# Patient Record
Sex: Female | Born: 1990 | Race: Black or African American | Hispanic: No | Marital: Married | Smoking: Never smoker
Health system: Southern US, Community
[De-identification: ages and names within clinical notes are randomized; demographics above are authoritative.]

## PROBLEM LIST (undated history)

## (undated) ENCOUNTER — Inpatient Hospital Stay (HOSPITAL_COMMUNITY): Payer: Self-pay

## (undated) DIAGNOSIS — E282 Polycystic ovarian syndrome: Secondary | ICD-10-CM

## (undated) DIAGNOSIS — Z789 Other specified health status: Secondary | ICD-10-CM

## (undated) DIAGNOSIS — O039 Complete or unspecified spontaneous abortion without complication: Secondary | ICD-10-CM

---

## 2009-06-16 ENCOUNTER — Emergency Department (HOSPITAL_COMMUNITY): Admission: EM | Admit: 2009-06-16 | Discharge: 2009-06-16 | Payer: Self-pay | Admitting: Emergency Medicine

## 2010-11-27 LAB — POCT I-STAT, CHEM 8
Hemoglobin: 15.3 g/dL — ABNORMAL HIGH (ref 12.0–15.0)
Sodium: 139 mEq/L (ref 135–145)
TCO2: 21 mmol/L (ref 0–100)

## 2010-11-27 LAB — SALICYLATE LEVEL
Salicylate Lvl: 10.2 mg/dL (ref 2.8–20.0)
Salicylate Lvl: 9.2 mg/dL (ref 2.8–20.0)

## 2010-11-27 LAB — RAPID URINE DRUG SCREEN, HOSP PERFORMED: Tetrahydrocannabinol: NOT DETECTED

## 2010-11-27 LAB — ACETAMINOPHEN LEVEL: Acetaminophen (Tylenol), Serum: 12.4 ug/mL (ref 10–30)

## 2011-02-15 ENCOUNTER — Emergency Department (HOSPITAL_COMMUNITY): Payer: No Typology Code available for payment source

## 2011-02-15 ENCOUNTER — Emergency Department (HOSPITAL_COMMUNITY)
Admission: EM | Admit: 2011-02-15 | Discharge: 2011-02-15 | Disposition: A | Discharge status: Home / Self Care | Payer: No Typology Code available for payment source | Attending: Emergency Medicine | Admitting: Emergency Medicine

## 2011-02-15 DIAGNOSIS — N946 Dysmenorrhea, unspecified: Secondary | ICD-10-CM | POA: Insufficient documentation

## 2011-02-15 DIAGNOSIS — R109 Unspecified abdominal pain: Secondary | ICD-10-CM | POA: Insufficient documentation

## 2011-02-15 LAB — CBC
MCHC: 34.4 g/dL (ref 30.0–36.0)
MCV: 89.4 fL (ref 78.0–100.0)
Platelets: 227 10*3/uL (ref 150–400)
RDW: 13.4 % (ref 11.5–15.5)
WBC: 4.5 10*3/uL (ref 4.0–10.5)

## 2011-02-15 LAB — URINALYSIS, ROUTINE W REFLEX MICROSCOPIC
Bilirubin Urine: NEGATIVE
Ketones, ur: NEGATIVE mg/dL
Leukocytes, UA: NEGATIVE
Nitrite: NEGATIVE
Protein, ur: NEGATIVE mg/dL
Urobilinogen, UA: 1 mg/dL (ref 0.0–1.0)
pH: 7 (ref 5.0–8.0)

## 2011-02-15 LAB — BASIC METABOLIC PANEL
Calcium: 9.9 mg/dL (ref 8.4–10.5)
Chloride: 103 mEq/L (ref 96–112)
Creatinine, Ser: 0.82 mg/dL (ref 0.50–1.10)
GFR calc Af Amer: >60 mL/min (ref >60)
GFR calc non Af Amer: >60 mL/min (ref >60)

## 2011-02-15 LAB — POCT PREGNANCY, URINE: Preg Test, Ur: NEGATIVE

## 2011-02-15 LAB — DIFFERENTIAL
Basophils Absolute: 0 10*3/uL (ref 0.0–0.1)
Eosinophils Absolute: 0.2 10*3/uL (ref 0.0–0.7)
Eosinophils Relative: 3 % (ref 0–5)
Lymphs Abs: 1.7 10*3/uL (ref 0.7–4.0)
Monocytes Absolute: 0.5 10*3/uL (ref 0.1–1.0)

## 2011-02-15 LAB — WET PREP, GENITAL

## 2011-02-17 LAB — GC/CHLAMYDIA PROBE AMP, GENITAL
Chlamydia, DNA Probe: NEGATIVE
GC Probe Amp, Genital: NEGATIVE

## 2011-05-14 ENCOUNTER — Emergency Department (HOSPITAL_COMMUNITY)
Admission: EM | Admit: 2011-05-14 | Discharge: 2011-05-15 | Disposition: A | Discharge status: Home / Self Care | Payer: PRIVATE HEALTH INSURANCE | Attending: Emergency Medicine | Admitting: Emergency Medicine

## 2011-05-14 DIAGNOSIS — N949 Unspecified condition associated with female genital organs and menstrual cycle: Secondary | ICD-10-CM | POA: Insufficient documentation

## 2011-05-14 DIAGNOSIS — R109 Unspecified abdominal pain: Secondary | ICD-10-CM | POA: Insufficient documentation

## 2011-05-14 DIAGNOSIS — O99891 Other specified diseases and conditions complicating pregnancy: Secondary | ICD-10-CM | POA: Insufficient documentation

## 2011-05-14 LAB — POCT I-STAT, CHEM 8
BUN: 5 mg/dL — ABNORMAL LOW (ref 6–23)
Calcium, Ion: 1.14 mmol/L (ref 1.12–1.32)
Chloride: 102 mEq/L (ref 96–112)
Creatinine, Ser: 0.9 mg/dL (ref 0.50–1.10)
Glucose, Bld: 90 mg/dL (ref 70–99)
HCT: 41 % (ref 36.0–46.0)
Hemoglobin: 13.9 g/dL (ref 12.0–15.0)
Potassium: 3.3 mEq/L — ABNORMAL LOW (ref 3.5–5.1)
Sodium: 139 mEq/L (ref 135–145)
TCO2: 22 mmol/L (ref 0–100)

## 2011-05-14 LAB — PREGNANCY, URINE: Preg Test, Ur: POSITIVE

## 2011-05-15 ENCOUNTER — Emergency Department (HOSPITAL_COMMUNITY): Payer: PRIVATE HEALTH INSURANCE

## 2011-05-15 LAB — URINALYSIS, ROUTINE W REFLEX MICROSCOPIC
Glucose, UA: NEGATIVE mg/dL
Ketones, ur: 15 mg/dL — AB
Leukocytes, UA: NEGATIVE
Protein, ur: NEGATIVE mg/dL
Urobilinogen, UA: 1 mg/dL (ref 0.0–1.0)

## 2011-05-15 LAB — WET PREP, GENITAL: Yeast Wet Prep HPF POC: NONE SEEN

## 2011-05-16 LAB — GC/CHLAMYDIA PROBE AMP, GENITAL
Chlamydia, DNA Probe: NEGATIVE
GC Probe Amp, Genital: NEGATIVE

## 2011-06-17 ENCOUNTER — Other Ambulatory Visit: Payer: Self-pay | Admitting: Obstetrics and Gynecology

## 2011-06-17 NOTE — H&P (Signed)
NAMECLYDE, [name] NO.:  [id number]  MEDICAL RECORD NO.:  [id number]  LOCATION:                                 FACILITY:  PHYSICIAN:  Hal Morales, M.D.DATE OF BIRTH:  [birth year]  DATE OF ADMISSION:  06/18/2011 DATE OF DISCHARGE:                             HISTORY & PHYSICAL   Robin [name] is a 20 year old, gravida 1, para 0, at 9 weeks 4 days by ultrasound with an identified intrauterine fetal demise by ultrasound on June 16, 2011.  The patient had presented for her new OB workup at [facility].  We were unable to hear fetal heart tones, and an ultrasound was done showing a 9 week 4 day intrauterine fetal demise. The patient had not had any bleeding or cramping of any significance. She was then sent home to consider her options and when we spoke with her on June 17, 2011, she was advised that she would like to proceed as soon as possible with D and E.  She therefore presents today for the outpatient procedure.  Her history has been remarkable for: 1. History of ovarian cysts, with PCOS noted in the past. 2. History of insulin resistance with PCOS.  PRENATAL LABS:  Blood type is O positive, Rh antibody negative, VDRL nonreactive, rubella titer positive, hepatitis B surface antigen negative, HIV was nonreactive.  Sickle cell test was negative.  GC and Chlamydia cultures were negative on May 15, 2011.  Urinalysis was normal on May 29, 2011.  Her hemoglobin upon entry into the Obstetrical Care was 12.6.  She had declined any genetic screening.  OBSTETRICAL HISTORY:  The patient is a primigravida.  MEDICAL HISTORY:  She reports usual childhood illnesses.  She was a previous condom and oral contraceptive user having used [place] and Loestrin 24 and Junel.  She has never had a Pap smear.  She has had a history of ovarian cysts in the past and received a diagnosis of probable polycystic ovarian syndrome over the last 1-2 years.   She was treated for Chlamydia in 2010.  She has occasional yeast infections. She has no known medication allergies.  FAMILY HISTORY:  Her maternal grandmother had elevated cholesterol and hypertension.  Her father and sister have sickle cell trait.  Her mother, maternal aunt, and maternal uncle have adult onset diabetes. Maternal uncle is on dialysis for kidney disease.  Her maternal grandmother also had breast cancer after menopause.  Her maternal aunt was in an abusive relationship, and her maternal aunt maternal uncle have used alcohol and drugs in the past.  GENETIC HISTORY:  Remarkable for father of baby's sister having a heart murmur at birth and father of baby's maternal first cousin having autism.  SOCIAL HISTORY:  The patient is single.  Father of baby is involved and supportive.  His name is Engineer, petroleum.  The patient is Tree surgeon.  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  Her partner is a Medical laboratory scientific officer in college and is in school full time.  She denies any alcohol, drug, or tobacco use during this pregnancy.  PHYSICAL EXAMINATION:  VITAL SIGNS:  Stable.  The patient is afebrile. HEENT:  Within normal limits. LUNGS:  Breath sounds are clear. HEART:  Regular rate and rhythm without murmur. BREASTS:  Soft and nontender. ABDOMEN:  Fundal height is approximately 8-10 weeks. GYN:  Uterus is nontender.  Pelvic exam performed on June 16, 2011 showed cervix to be closed.  Uterus was approximately 8-10 week size and nontender.  There was no vaginal discharge. Urinalysis that day was also negative. EXTREMITIES:  Deep tendon flexes are 2+ without clonus.  There is no edema noted.  Ultrasound on June 16, 2011, showed a 9 week 4 day intrauterine fetal demise.  IMPRESSION: 1. Intrauterine fetal demise at 9 weeks 4 days. 2. Rh positive blood type 3. The patient desires dilatation and evacuation.  PLAN: 1.  Admit to [facility] per consult with Dr. Dierdre Forth as attending physician. 2. Routine physician preoperative orders. 3. Support to the patient and her partner for their loss.     Robin [name] Robin [name], C.N.M.   ______________________________ Hal Morales, M.D.    VLL/MEDQ  D:  06/17/2011  T:  06/17/2011  Job:  [id number]

## 2011-06-18 ENCOUNTER — Ambulatory Visit (HOSPITAL_COMMUNITY): Payer: No Typology Code available for payment source | Admitting: Anesthesiology

## 2011-06-18 ENCOUNTER — Encounter (HOSPITAL_COMMUNITY): Payer: Self-pay

## 2011-06-18 ENCOUNTER — Ambulatory Visit (HOSPITAL_COMMUNITY)
Admission: RE | Admit: 2011-06-18 | Discharge: 2011-06-18 | Disposition: A | Discharge status: Home / Self Care | Payer: No Typology Code available for payment source | Source: Ambulatory Visit | Attending: Obstetrics and Gynecology | Admitting: Obstetrics and Gynecology

## 2011-06-18 ENCOUNTER — Other Ambulatory Visit: Payer: Self-pay | Admitting: Obstetrics and Gynecology

## 2011-06-18 ENCOUNTER — Encounter (HOSPITAL_COMMUNITY): Payer: Self-pay | Admitting: Anesthesiology

## 2011-06-18 ENCOUNTER — Encounter (HOSPITAL_COMMUNITY)
Admission: RE | Disposition: A | Discharge status: Home / Self Care | Payer: Self-pay | Source: Ambulatory Visit | Attending: Obstetrics and Gynecology

## 2011-06-18 DIAGNOSIS — O021 Missed abortion: Secondary | ICD-10-CM | POA: Diagnosis present

## 2011-06-18 HISTORY — DX: Other specified health status: Z78.9

## 2011-06-18 HISTORY — PX: DILATION AND EVACUATION: SHX1459

## 2011-06-18 LAB — CBC
HCT: 37.8 % (ref 36.0–46.0)
MCHC: 34.4 g/dL (ref 30.0–36.0)
Platelets: 214 10*3/uL (ref 150–400)
RDW: 13.4 % (ref 11.5–15.5)

## 2011-06-18 SURGERY — DILATION AND EVACUATION, UTERUS
Anesthesia: Monitor Anesthesia Care | Site: Vagina | Wound class: Clean Contaminated

## 2011-06-18 MED ORDER — DOXYCYCLINE HYCLATE 100 MG PO TABS: 100.0000 mg | ORAL_TABLET | Freq: Two times a day (BID) | ORAL | Status: AC

## 2011-06-18 MED ORDER — LIDOCAINE HCL (CARDIAC) 20 MG/ML IV SOLN
INTRAVENOUS | Status: AC
  Filled 2011-06-18: qty 5

## 2011-06-18 MED ORDER — METHYLERGONOVINE MALEATE 0.2 MG PO TABS: 0.2000 mg | ORAL_TABLET | Freq: Four times a day (QID) | ORAL | Status: DC

## 2011-06-18 MED ORDER — LIDOCAINE HCL 2 % IJ SOLN
INTRAMUSCULAR | Status: DC | PRN
  Administered 2011-06-18: 10 mL

## 2011-06-18 MED ORDER — KETOROLAC TROMETHAMINE 30 MG/ML IJ SOLN
INTRAMUSCULAR | Status: AC
  Filled 2011-06-18: qty 1

## 2011-06-18 MED ORDER — FENTANYL CITRATE 0.05 MG/ML IJ SOLN
INTRAMUSCULAR | Status: DC | PRN
  Administered 2011-06-18: 100 ug via INTRAVENOUS

## 2011-06-18 MED ORDER — MIDAZOLAM HCL 2 MG/2ML IJ SOLN
INTRAMUSCULAR | Status: AC
  Filled 2011-06-18: qty 2

## 2011-06-18 MED ORDER — FENTANYL CITRATE 0.05 MG/ML IJ SOLN
INTRAMUSCULAR | Status: AC
  Filled 2011-06-18: qty 2

## 2011-06-18 MED ORDER — DEXAMETHASONE SODIUM PHOSPHATE 10 MG/ML IJ SOLN
INTRAMUSCULAR | Status: AC
  Filled 2011-06-18: qty 1

## 2011-06-18 MED ORDER — PROPOFOL 10 MG/ML IV EMUL
INTRAVENOUS | Status: AC
  Filled 2011-06-18: qty 50

## 2011-06-18 MED ORDER — ONDANSETRON HCL 4 MG/2ML IJ SOLN: 4.0000 mg | Freq: Once | INTRAMUSCULAR | Status: DC | PRN

## 2011-06-18 MED ORDER — MEPERIDINE HCL 25 MG/ML IJ SOLN: 6.2500 mg | INTRAMUSCULAR | Status: DC | PRN

## 2011-06-18 MED ORDER — KETOROLAC TROMETHAMINE 30 MG/ML IJ SOLN: 15.0000 mg | Freq: Once | INTRAMUSCULAR | Status: DC | PRN

## 2011-06-18 MED ORDER — LIDOCAINE HCL (CARDIAC) 20 MG/ML IV SOLN
INTRAVENOUS | Status: DC | PRN
  Administered 2011-06-18: 60 mg via INTRAVENOUS

## 2011-06-18 MED ORDER — PROPOFOL 10 MG/ML IV EMUL
INTRAVENOUS | Status: DC | PRN
  Administered 2011-06-18: 200 ug/kg/min via INTRAVENOUS

## 2011-06-18 MED ORDER — LACTATED RINGERS IV SOLN
INTRAVENOUS | Status: DC
  Administered 2011-06-18 (×2): via INTRAVENOUS

## 2011-06-18 MED ORDER — MIDAZOLAM HCL 5 MG/5ML IJ SOLN
INTRAMUSCULAR | Status: DC | PRN
  Administered 2011-06-18: 2 mg via INTRAVENOUS

## 2011-06-18 MED ORDER — FENTANYL CITRATE 0.05 MG/ML IJ SOLN: 25.0000 ug | INTRAMUSCULAR | Status: DC | PRN

## 2011-06-18 MED ORDER — METHYLERGONOVINE MALEATE 0.2 MG/ML IJ SOLN
INTRAMUSCULAR | Status: DC | PRN
  Administered 2011-06-18: 0.2 mg via INTRAMUSCULAR

## 2011-06-18 MED ORDER — ONDANSETRON HCL 4 MG/2ML IJ SOLN
INTRAMUSCULAR | Status: AC
  Filled 2011-06-18: qty 2

## 2011-06-18 MED ORDER — KETOROLAC TROMETHAMINE 30 MG/ML IJ SOLN
INTRAMUSCULAR | Status: DC | PRN
  Administered 2011-06-18: 30 mg via INTRAVENOUS

## 2011-06-18 MED ORDER — METHYLERGONOVINE MALEATE 0.2 MG/ML IJ SOLN
INTRAMUSCULAR | Status: AC
  Filled 2011-06-18: qty 1

## 2011-06-18 MED ORDER — IBUPROFEN 200 MG PO TABS: 600.0000 mg | ORAL_TABLET | Freq: Four times a day (QID) | ORAL | Status: AC

## 2011-06-18 MED ORDER — DEXAMETHASONE SODIUM PHOSPHATE 10 MG/ML IJ SOLN
INTRAMUSCULAR | Status: DC | PRN
  Administered 2011-06-18: 10 mg via INTRAVENOUS

## 2011-06-18 MED ORDER — ONDANSETRON HCL 4 MG/2ML IJ SOLN
INTRAMUSCULAR | Status: DC | PRN
  Administered 2011-06-18: 4 mg via INTRAVENOUS

## 2011-06-18 SURGICAL SUPPLY — 19 items
CATH ROBINSON RED A/P 16FR (CATHETERS) ×2 IMPLANT
CLOTH BEACON ORANGE TIMEOUT ST (SAFETY) ×2 IMPLANT
DECANTER SPIKE VIAL GLASS SM (MISCELLANEOUS) ×2 IMPLANT
DILATOR CANAL MILEX (MISCELLANEOUS) IMPLANT
DRAPE UTILITY XL STRL (DRAPES) ×2 IMPLANT
GLOVE SURG SS PI 6.5 STRL IVOR (GLOVE) ×4 IMPLANT
GOWN PREVENTION PLUS LG XLONG (DISPOSABLE) ×2 IMPLANT
KIT BERKELEY 1ST TRIMESTER 3/8 (MISCELLANEOUS) ×2 IMPLANT
NEEDLE SPNL 22GX3.5 QUINCKE BK (NEEDLE) ×2 IMPLANT
NS IRRIG 1000ML POUR BTL (IV SOLUTION) ×2 IMPLANT
PACK VAGINAL MINOR WOMEN LF (CUSTOM PROCEDURE TRAY) ×2 IMPLANT
PAD PREP 24X48 CUFFED NSTRL (MISCELLANEOUS) ×2 IMPLANT
SET BERKELEY SUCTION TUBING (SUCTIONS) ×2 IMPLANT
SYR CONTROL 10ML LL (SYRINGE) ×2 IMPLANT
TOWEL OR 17X24 6PK STRL BLUE (TOWEL DISPOSABLE) ×4 IMPLANT
VACURETTE 10 RIGID CVD (CANNULA) IMPLANT
VACURETTE 7MM CVD STRL WRAP (CANNULA) IMPLANT
VACURETTE 8 RIGID CVD (CANNULA) IMPLANT
VACURETTE 9 RIGID CVD (CANNULA) IMPLANT

## 2011-06-18 NOTE — Op Note (Signed)
06/18/2011  2:41 PM  PATIENT:  Robin [name]  20 y.o. female  PRE-OPERATIVE DIAGNOSIS:  missed abortion  POST-OPERATIVE DIAGNOSIS:  missed abortion  PROCEDURE:  Procedure(s): DILATATION AND EVACUATION (D&E)  SURGEON:  Surgeon(s): Hal Morales, MD  ASSISTANTS: none   ANESTHESIA:   local  ESTIMATED BLOOD LOSS: * No blood loss amount entered *   COMPLICATIONS: none  FINDINGS:Uterus 8 weeks size.  Moderate amount of products of conception  BLOOD ADMINISTERED:none  LOCAL MEDICATIONS USED:  LIDOCAINE 10CC  SPECIMEN:  Source of Specimen:  products of conception  DISPOSITION OF SPECIMEN:  PATHOLOGY  COUNTS:  YES  DESCRIPTION OF PROCEDURE:  The patient was taken the operating room after appropriate identification placed on the operating table. After the attainment of adequate anesthesia she was placed in the lithotomy position. Perineum and vagina were prepped with multiple areas of Betadine and a straight in and out catheter used to empty the bladder. The perineum was draped as a sterile field. A weighted speculum was placed in the posterior vagina and a paracervical block achieved a total of 10 cc of 2% Xylocaine and the 5 and 7:00 positions. A single-tooth tenaculum was placed on the anterior cervix and the cervix dilated to accommodate a number 7 suction catheter. The suction catheter was then used to suction evacuate all quadrants of the uterus. A sharp curet was used to ensure that all products of conception had been removed. All instruments were then removed from the vagina and the patient had her anesthetic reversed and was taken to the recovery room in satisfactory condition having tolerated the procedure well sponge and instrument counts correct.  PLAN OF CARE: discharge home  PATIENT DISPOSITION:  PACU - hemodynamically stable.   Delay start of Pharmacological VTE agent (>24hrs) due to surgical blood loss or risk of bleeding:  not applicable  Patient given  Methergine 0.2mg  IM intraoperatively with excellent hemostasis  Hal Morales, MD 2:41 PM

## 2011-06-18 NOTE — Anesthesia Preprocedure Evaluation (Signed)
Anesthesia Evaluation  Patient identified by MRN, date of birth, ID band Patient awake  General Assessment Comment  Reviewed: Allergy & Precautions, H&P , NPO status , Patient's Chart, lab work & pertinent test results  Airway Mallampati: I TM Distance: >3 FB Neck ROM: full    Dental No notable dental hx.    Pulmonary    Pulmonary exam normal       Cardiovascular     Neuro/Psych Negative Neurological ROS  Negative Psych ROS   GI/Hepatic negative GI ROS Neg liver ROS    Endo/Other  Negative Endocrine ROS  Renal/GU negative Renal ROS  Genitourinary negative   Musculoskeletal negative musculoskeletal ROS (+)   Abdominal Normal abdominal exam  (+)   Peds negative pediatric ROS (+)  Hematology negative hematology ROS (+)   Anesthesia Other Findings   Reproductive/Obstetrics (+) Pregnancy                           Anesthesia Physical Anesthesia Plan  ASA: II  Anesthesia Plan: MAC   Post-op Pain Management:    Induction: Intravenous  Airway Management Planned: Simple Face Mask and Nasal Cannula  Additional Equipment:   Intra-op Plan:   Post-operative Plan:   Informed Consent: I have reviewed the patients History and Physical, chart, labs and discussed the procedure including the risks, benefits and alternatives for the proposed anesthesia with the patient or authorized representative who has indicated his/her understanding and acceptance.     Plan Discussed with: CRNA  Anesthesia Plan Comments:         Anesthesia Quick Evaluation

## 2011-06-18 NOTE — Transfer of Care (Signed)
Immediate Anesthesia Transfer of Care Note  Patient: Robin [name]  Procedure(s) Performed:  DILATATION AND EVACUATION (D&E)  Patient Location: PACU  Anesthesia Type: MAC  Level of Consciousness: awake, alert  and oriented  Airway & Oxygen Therapy: Patient Spontanous Breathing and Patient connected to nasal cannula oxygen  Post-op Assessment: Report given to PACU RN and Post -op Vital signs reviewed and stable  Post vital signs: stable  Complications: No apparent anesthesia complications

## 2011-06-18 NOTE — H&P (Signed)
Transcription Text  NAME: SHARNE, LINDERS NO.: [id number]  MEDICAL RECORD NO.: [id number]  LOCATION: FACILITY:  PHYSICIAN: Hal Morales, M.D.DATE OF BIRTH: [birth year]  DATE OF ADMISSION: 06/18/2011  DATE OF DISCHARGE:  HISTORY & PHYSICAL  Ms. Tartt is a 20 year old, gravida 1, para 0, at 9 weeks 4 days by  ultrasound with an identified intrauterine fetal demise by ultrasound on  June 16, 2011. The patient had presented for her new OB workup at  [facility]. We were unable to hear fetal heart tones, and  an ultrasound was done showing a 9 week 4 day intrauterine fetal demise.  The patient had not had any bleeding or cramping of any significance.  She was then sent home to consider her options and when we spoke with  her on June 17, 2011, she was advised that she would like to proceed  as soon as possible with D and E. She therefore presents today for the  outpatient procedure.  Her history has been remarkable for:  1. History of ovarian cysts, with PCOS noted in the past.  2. History of insulin resistance with PCOS.  PRENATAL LABS: Blood type is O positive, Rh antibody negative, VDRL  nonreactive, rubella titer positive, hepatitis B surface antigen  negative, HIV was nonreactive. Sickle cell test was negative. GC and  Chlamydia cultures were negative on May 15, 2011. Urinalysis was  normal on May 29, 2011. Her hemoglobin upon entry into the  Obstetrical Care was 12.6. She had declined any genetic screening.  OBSTETRICAL HISTORY: The patient is a primigravida.  MEDICAL HISTORY: She reports usual childhood illnesses. She was a  previous condom and oral contraceptive user having used [place] and  Loestrin 24 and Junel. She has never had a Pap smear. She has had a  history of ovarian cysts in the past and received a diagnosis of  probable polycystic ovarian syndrome over the last 1-2 years. She was  treated for Chlamydia in 2010. She has occasional  yeast infections.  She has no known medication allergies.  FAMILY HISTORY: Her maternal grandmother had elevated cholesterol and  hypertension. Her father and sister have sickle cell trait. Her  mother, maternal aunt, and maternal uncle have adult onset diabetes.  Maternal uncle is on dialysis for kidney disease. Her maternal  grandmother also had breast cancer after menopause. Her maternal aunt  was in an abusive relationship, and her maternal aunt maternal uncle  have used alcohol and drugs in the past.  GENETIC HISTORY: Remarkable for father of baby's sister having a heart  murmur at birth and father of baby's maternal first cousin having  autism.  SOCIAL HISTORY: The patient is single. Father of baby is involved and  supportive. His name is Engineer, petroleum. The patient is Patent examiner. 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 Her partner is a Medical laboratory scientific officer in  college and is in school full time. She denies any alcohol, drug, or  tobacco use during this pregnancy.  PHYSICAL EXAMINATION: VITAL SIGNS: Stable. The patient is afebrile.  HEENT: Within normal limits.  LUNGS: Breath sounds are clear.  HEART: Regular rate and rhythm without murmur.  BREASTS: Soft and nontender.  ABDOMEN: Fundal height is approximately 8-10 weeks.  GYN: Uterus is nontender. Pelvic exam performed on June 16, 2011  showed cervix to be closed. Uterus was approximately 8-10 week size and  nontender. There was no vaginal discharge.  Urinalysis that day was  also negative.  EXTREMITIES: Deep tendon flexes are 2+ without clonus. There is no  edema noted.  Ultrasound on June 16, 2011, showed a 9 week 4 day intrauterine fetal  demise.  IMPRESSION:  1. Intrauterine fetal demise at 9 weeks 4 days.  2. Rh positive blood type  3. The patient desires dilatation and evacuation.  PLAN:  1. Admit to [facility] per consult with Dr. Dierdre Forth as attending physician.  2. Routine physician preoperative orders.  3. Support to the patient and her partner for their loss.  Renaldo Reel Emilee Hero, C.N.M.

## 2011-06-18 NOTE — H&P (Signed)
  I have examined the patient and reviewd her history with her.  There is no change in the history or physical.  I reviewed the D&E procedure, its indication, alternative therapies, and the risks involved.  The patient acknowledges understanding and that all her questions have been answered.The likelihood that this procedure will evacuate the pregnancy is high and this has been explained.

## 2011-06-18 NOTE — Anesthesia Postprocedure Evaluation (Signed)
Anesthesia Post Note  Patient: Robin [name]  Procedure(s) Performed:  DILATATION AND EVACUATION (D&E)  Anesthesia type: MAC  Patient location: PACU  Post pain: Pain level controlled  Post assessment: Post-op Vital signs reviewed  Last Vitals:  Filed Vitals:   06/18/11 1530  BP:   Pulse: 67  Temp:   Resp: 35    Post vital signs: Reviewed  Level of consciousness: sedated  Complications: No apparent anesthesia complications

## 2011-06-22 ENCOUNTER — Encounter (HOSPITAL_COMMUNITY): Payer: Self-pay | Admitting: Obstetrics and Gynecology

## 2012-01-15 ENCOUNTER — Encounter: Payer: Self-pay | Admitting: Obstetrics and Gynecology

## 2012-01-24 ENCOUNTER — Encounter (HOSPITAL_COMMUNITY): Payer: Self-pay | Admitting: Emergency Medicine

## 2012-01-24 ENCOUNTER — Emergency Department (HOSPITAL_COMMUNITY): Payer: Medicaid Other

## 2012-01-24 ENCOUNTER — Emergency Department (HOSPITAL_COMMUNITY)
Admission: EM | Admit: 2012-01-24 | Discharge: 2012-01-24 | Disposition: A | Discharge status: Home / Self Care | Payer: Medicaid Other | Attending: Emergency Medicine | Admitting: Emergency Medicine

## 2012-01-24 DIAGNOSIS — O200 Threatened abortion: Secondary | ICD-10-CM | POA: Insufficient documentation

## 2012-01-24 HISTORY — DX: Complete or unspecified spontaneous abortion without complication: O03.9

## 2012-01-24 HISTORY — DX: Polycystic ovarian syndrome: E28.2

## 2012-01-24 LAB — URINALYSIS, ROUTINE W REFLEX MICROSCOPIC
Glucose, UA: NEGATIVE mg/dL
Nitrite: NEGATIVE
Specific Gravity, Urine: 1.02 (ref 1.005–1.030)
pH: 6 (ref 5.0–8.0)

## 2012-01-24 LAB — CBC
Hemoglobin: 12.6 g/dL (ref 12.0–15.0)
MCH: 31 pg (ref 26.0–34.0)
Platelets: 235 10*3/uL (ref 150–400)
RBC: 4.06 MIL/uL (ref 3.87–5.11)
WBC: 6.1 10*3/uL (ref 4.0–10.5)

## 2012-01-24 LAB — DIFFERENTIAL
Eosinophils Absolute: 0.1 10*3/uL (ref 0.0–0.7)
Lymphocytes Relative: 24 % (ref 12–46)
Lymphs Abs: 1.4 10*3/uL (ref 0.7–4.0)
Neutro Abs: 4 10*3/uL (ref 1.7–7.7)
Neutrophils Relative %: 65 % (ref 43–77)

## 2012-01-24 LAB — URINE MICROSCOPIC-ADD ON

## 2012-01-24 LAB — HCG, QUANTITATIVE, PREGNANCY: hCG, Beta Chain, Quant, S: 10761 m[IU]/mL — ABNORMAL HIGH (ref <5)

## 2012-01-24 LAB — POCT PREGNANCY, URINE: Preg Test, Ur: POSITIVE — AB

## 2012-01-24 NOTE — ED Notes (Signed)
Pt. Alert and oriented, ambulatory, NAD noted

## 2012-01-24 NOTE — ED Provider Notes (Signed)
History     CSN: [id number]  Arrival date & time 01/24/12  1709   First MD Initiated Contact with Patient 01/24/12 1738      Chief Complaint  Patient presents with  . Vaginal Bleeding    (Consider location/radiation/quality/duration/timing/severity/associated sxs/prior treatment) HPI Comments: Patient is pregnant, G2 P0010, she believes about 6-7 weeks.  She noticed blood in her underwear this afternoon.    Patient is a 21 y.o. female presenting with vaginal bleeding. The history is provided by the patient.  Vaginal Bleeding This is a new problem. Episode onset: this afternoon. Episode frequency: once. The problem has not changed since onset.Pertinent negatives include no abdominal pain. The symptoms are aggravated by nothing. The symptoms are relieved by nothing. She has tried nothing for the symptoms.    Past Medical History  Diagnosis Date  . No pertinent past medical history   . PCOS (polycystic ovarian syndrome)   . Miscarriage     Past Surgical History  Procedure Date  . No past surgeries   . Dilation and evacuation 06/18/2011    Procedure: DILATATION AND EVACUATION (D&E);  Surgeon: Hal Morales, MD;  Location: [facility];  Service: Gynecology;  Laterality: N/A;    No family history on file.  History  Substance Use Topics  . Smoking status: Never Smoker   . Smokeless tobacco: Never Used  . Alcohol Use: No    OB History    Grav Para Term Preterm Abortions TAB SAB Ect Mult Living   1               Review of Systems  Gastrointestinal: Negative for abdominal pain.  Genitourinary: Positive for vaginal bleeding.  All other systems reviewed and are negative.    Allergies  Review of patient's allergies indicates no known allergies.  Home Medications   Current Outpatient Rx  Name Route Sig Dispense Refill  . PRENATAL MULTIVITAMIN CH Oral Take 1 tablet by mouth daily.      BP 120/74  Pulse 105  Temp(Src) 98.3 F (36.8 C) (Oral)  Resp 20  Wt 170 lb  (77.111 kg)  SpO2 100%  LMP 04/07/2011  Physical Exam  Nursing note and vitals reviewed. Constitutional: She is oriented to person, place, and time. She appears well-developed and well-nourished. No distress.  HENT:  Head: Normocephalic and atraumatic.  Neck: Normal range of motion. Neck supple.  Cardiovascular: Normal rate and regular rhythm.  Exam reveals no gallop and no friction rub.   No murmur heard. Pulmonary/Chest: Effort normal and breath sounds normal. No respiratory distress. She has no wheezes.  Abdominal: Soft. Bowel sounds are normal. She exhibits no distension. There is no tenderness.  Musculoskeletal: Normal range of motion.  Neurological: She is alert and oriented to person, place, and time.  Skin: Skin is warm and dry. She is not diaphoretic.    ED Course  Procedures (including critical care time)  Labs Reviewed  POCT PREGNANCY, URINE - Abnormal; Notable for the following:    Preg Test, Ur POSITIVE (*)    All other components within normal limits  URINALYSIS, ROUTINE W REFLEX MICROSCOPIC  CBC  DIFFERENTIAL  ABO/RH  HCG, QUANTITATIVE, PREGNANCY   No results found.   No diagnosis found.    MDM  The Bhcg is consistent with stated gestational age.   The US shows an iup, but we are unable to find a heartbeat.  I explained to the patient that this is either due to the pregnancy being too early  or fetal demise.  I also explained to her that time will sort this out.  She needs to follow up with OB in the next 2-3 days and she should call tomorrow to schedule an appointment.  Return prn.        Geoffery Lyons, MD 01/24/12 2018

## 2012-01-24 NOTE — ED Notes (Signed)
Pt presenting to ed with c/o spotting pt states she's [redacted] weeks pregnant. Pt states she is unsure of how much blood she just saw a little bit in her underwear. Pt denies dizziness at this time.

## 2012-01-28 ENCOUNTER — Inpatient Hospital Stay (HOSPITAL_COMMUNITY): Payer: Medicaid Other

## 2012-01-28 ENCOUNTER — Encounter (HOSPITAL_COMMUNITY): Payer: Self-pay

## 2012-01-28 ENCOUNTER — Inpatient Hospital Stay (HOSPITAL_COMMUNITY)
Admission: AD | Admit: 2012-01-28 | Discharge: 2012-01-28 | Disposition: A | Discharge status: Hospice | Payer: Medicaid Other | Source: Ambulatory Visit | Attending: Obstetrics and Gynecology | Admitting: Obstetrics and Gynecology

## 2012-01-28 DIAGNOSIS — O209 Hemorrhage in early pregnancy, unspecified: Secondary | ICD-10-CM

## 2012-01-28 DIAGNOSIS — O99891 Other specified diseases and conditions complicating pregnancy: Secondary | ICD-10-CM | POA: Insufficient documentation

## 2012-01-28 DIAGNOSIS — O200 Threatened abortion: Secondary | ICD-10-CM

## 2012-01-28 DIAGNOSIS — E282 Polycystic ovarian syndrome: Secondary | ICD-10-CM

## 2012-01-28 NOTE — MAU Provider Note (Signed)
  History     CSN: [id number]  Arrival date and time: 01/28/12 1956   First Provider Initiated Contact with Patient 01/28/12 2154      No chief complaint on file.  HPI Comments: Pt is a G2P0 at 6wks here for f/u US for viability. She was seen in the ED on 6/2 for vag bleeding and had a QHCG that was about 10,000 and an [place] that showed an intrauterine GS but no FP or CA. She has no c/o today, has had no bleeding or cramping.      Past Medical History  Diagnosis Date  . No pertinent past medical history   . PCOS (polycystic ovarian syndrome)   . Miscarriage     Past Surgical History  Procedure Date  . No past surgeries   . Dilation and evacuation 06/18/2011    Procedure: DILATATION AND EVACUATION (D&E);  Surgeon: Hal Morales, MD;  Location: [facility];  Service: Gynecology;  Laterality: N/A;    Family History  Problem Relation Age of Onset  . Arthritis Mother   . Diabetes Maternal Aunt   . Diabetes Maternal Grandmother   . Cancer Maternal Grandmother     History  Substance Use Topics  . Smoking status: Never Smoker   . Smokeless tobacco: Never Used  . Alcohol Use: No    Allergies: No Known Allergies  Prescriptions prior to admission  Medication Sig Dispense Refill  . Prenatal Vit-Fe Fumarate-FA (PRENATAL MULTIVITAMIN) TABS Take 1 tablet by mouth daily.        Review of Systems  All other systems reviewed and are negative.   Physical Exam   Blood pressure 152/75, pulse 87, temperature 99.4 F (37.4 C), temperature source Oral, resp. rate 18, last menstrual period 04/07/2011, SpO2 100.00%.  Physical Exam  Nursing note and vitals reviewed. Constitutional: She is oriented to person, place, and time. She appears well-developed and well-nourished.  HENT:  Head: Normocephalic.  Cardiovascular: Normal rate.   Respiratory: Effort normal.  GI: Soft. There is no tenderness.  Genitourinary:       deferred  Musculoskeletal: Normal range of motion.    Neurological: She is alert and oriented to person, place, and time.  Skin: Skin is warm and dry.  Psychiatric: She has a normal mood and affect. Her behavior is normal.    MAU Course  Procedures    Assessment and Plan  SIUP w CRL of [redacted]w[redacted]d w EDC of 09/22/12 - report states 2/13 however after talking to [place] tech that date is an error  FHR 108 No SCH Norm ovaries and adenxa   Pt to call office for f/u US for viability in 1 week Bleeding precautions and healthy preg reviewed DC'd home stable cond D/W Dr Normand Sloop  Malissa Hippo 01/28/2012, 10:02 PM

## 2012-01-28 NOTE — Discharge Instructions (Signed)
ABCs of Pregnancy A Antepartum care is very important. Be sure you see your doctor and get prenatal care as soon as you think you are pregnant. At this time, you will be tested for infection, genetic abnormalities and potential problems with you and the pregnancy. This is the time to discuss diet, exercise, work, medications, labor, pain medication during labor and the possibility of a cesarean delivery. Ask any questions that may concern you. It is important to see your doctor regularly throughout your pregnancy. Avoid exposure to toxic substances and chemicals - such as cleaning solvents, lead and mercury, some insecticides, and paint. Pregnant women should avoid exposure to paint fumes, and fumes that cause you to feel ill, dizzy or faint. When possible, it is a good idea to have a pre-pregnancy consultation with your caregiver to begin some important recommendations your caregiver suggests such as, taking folic acid, exercising, quitting smoking, avoiding alcoholic beverages, etc. B Breastfeeding is the healthiest choice for both you and your baby. It has many nutritional benefits for the baby and health benefits for the mother. It also creates a very tight and loving bond between the baby and mother. Talk to your doctor, your family and friends, and your employer about how you choose to feed your baby and how they can support you in your decision. Not all birth defects can be prevented, but a woman can take actions that may increase her chance of having a healthy baby. Many birth defects happen very early in pregnancy, sometimes before a woman even knows she is pregnant. Birth defects or abnormalities of any child in your or the father's family should be discussed with your caregiver. Get a good support bra as your breast size changes. Wear it especially when you exercise and when nursing.  C Celebrate the news of your pregnancy with the your spouse/father and family. Childbirth classes are helpful to  take for you and the spouse/father because it helps to understand what happens during the pregnancy, labor and delivery. Cesarean delivery should be discussed with your doctor so you are prepared for that possibility. The pros and cons of circumcision if it is a boy, should be discussed with your pediatrician. Cigarette smoking during pregnancy can result in low birth weight babies. It has been associated with infertility, miscarriages, tubal pregnancies, infant death (mortality) and poor health (morbidity) in childhood. Additionally, cigarette smoking may cause long-term learning disabilities. If you smoke, you should try to quit before getting pregnant and not smoke during the pregnancy. Secondary smoke may also harm a mother and her developing baby. It is a good idea to ask people to stop smoking around you during your pregnancy and after the baby is born. Extra calcium is necessary when you are pregnant and is found in your prenatal vitamin, in dairy products, green leafy vegetables and in calcium supplements. D A healthy diet according to your current weight and height, along with vitamins and mineral supplements should be discussed with your caregiver. Domestic abuse or violence should be made known to your doctor right away to get the situation corrected. Drink more water when you exercise to keep hydrated. Discomfort of your back and legs usually develops and progresses from the middle of the second trimester through to delivery of the baby. This is because of the enlarging baby and uterus, which may also affect your balance. Do not take illegal drugs. Illegal drugs can seriously harm the baby and you. Drink extra fluids (water is best) throughout pregnancy to help   your body keep up with the increases in your blood volume. Drink at least 6 to 8 glasses of water, fruit juice, or milk each day. A good way to know you are drinking enough fluid is when your urine looks almost like clear water or is very light  yellow.  E Eat healthy to get the nutrients you and your unborn baby need. Your meals should include the five basic food groups. Exercise (30 minutes of light to moderate exercise a day) is important and encouraged during pregnancy, if there are no medical problems or problems with the pregnancy. Exercise that causes discomfort or dizziness should be stopped and reported to your caregiver. Emotions during pregnancy can change from being ecstatic to depression and should be understood by you, your partner and your family. F Fetal screening with ultrasound, amniocentesis and monitoring during pregnancy and labor is common and sometimes necessary. Take 400 micrograms of folic acid daily both before, when possible, and during the first few months of pregnancy to reduce the risk of birth defects of the brain and spine. All women who could possibly become pregnant should take a vitamin with folic acid, every day. It is also important to eat a healthy diet with fortified foods (enriched grain products, including cereals, rice, breads, and pastas) and foods with natural sources of folate (orange juice, green leafy vegetables, beans, peanuts, broccoli, asparagus, peas, and lentils). The father should be involved with all aspects of the pregnancy including, the prenatal care, childbirth classes, labor, delivery, and postpartum time. Fathers may also have emotional concerns about being a father, financial needs, and raising a family. G Genetic testing should be done appropriately. It is important to know your family and the father's history. If there have been problems with pregnancies or birth defects in your family, report these to your doctor. Also, genetic counselors can talk with you about the information you might need in making decisions about having a family. You can call a major medical center in your area for help in finding a board-certified genetic counselor. Genetic testing and counseling should be done  before pregnancy when possible, especially if there is a history of problems in the mother's or father's family. Certain ethnic backgrounds are more at risk for genetic defects. H Get familiar with the hospital where you will be having your baby. Get to know how long it takes to get there, the labor and delivery area, and the hospital procedures. Be sure your medical insurance is accepted there. Get your home ready for the baby including, clothes, the baby's room (when possible), furniture and car seat. Hand washing is important throughout the day, especially after handling raw meat and poultry, changing the baby's diaper or using the bathroom. This can help prevent the spread of many bacteria and viruses that cause infection. Your hair may become dry and thinner, but will return to normal a few weeks after the baby is born. Heartburn is a common problem that can be treated by taking antacids recommended by your caregiver, eating smaller meals 5 or 6 times a day, not drinking liquids when eating, drinking between meals and raising the head of your bed 2 to 3 inches. I Insurance to cover you, the baby, doctor and hospital should be reviewed so that you will be prepared to pay any costs not covered by your insurance plan. If you do not have medical insurance, there are usually clinics and services available for you in your community. Take 30 milligrams of iron during   your pregnancy as prescribed by your doctor to reduce the risk of low red blood cells (anemia) later in pregnancy. All women of childbearing age should eat a diet rich in iron. J There should be a joint effort for the mother, father and any other children to adapt to the pregnancy financially, emotionally, and psychologically during the pregnancy. Join a support group for moms-to-be. Or, join a class on parenting or childbirth. Have the family participate when possible. K Know your limits. Let your caregiver know if you experience any of the  following:   Pain of any kind.   Strong cramps.   You develop a lot of weight in a short period of time (5 pounds in 3 to 5 days).   Vaginal bleeding, leaking of amniotic fluid.   Headache, vision problems.   Dizziness, fainting, shortness of breath.   Chest pain.   Fever of 102 F (38.9 C) or higher.   Gush of clear fluid from your vagina.   Painful urination.   Domestic violence.   Irregular heartbeat (palpitations).   Rapid beating of the heart (tachycardia).   Constant feeling sick to your stomach (nauseous) and vomiting.   Trouble walking, fluid retention (edema).   Muscle weakness.   If your baby has decreased activity.   Persistent diarrhea.   Abnormal vaginal discharge.   Uterine contractions at 20-minute intervals.   Back pain that travels down your leg.  L Learn and practice that what you eat and drink should be in moderation and healthy for you and your baby. Legal drugs such as alcohol and caffeine are important issues for pregnant women. There is no safe amount of alcohol a woman can drink while pregnant. Fetal alcohol syndrome, a disorder characterized by growth retardation, facial abnormalities, and central nervous system dysfunction, is caused by a woman's use of alcohol during pregnancy. Caffeine, found in tea, coffee, soft drinks and chocolate, should also be limited. Be sure to read labels when trying to cut down on caffeine during pregnancy. More than 200 foods, beverages, and over-the-counter medications contain caffeine and have a high salt content! There are coffees and teas that do not contain caffeine. M Medical conditions such as diabetes, epilepsy, and high blood pressure should be treated and kept under control before pregnancy when possible, but especially during pregnancy. Ask your caregiver about any medications that may need to be changed or adjusted during pregnancy. If you are currently taking any medications, ask your caregiver if it  is safe to take them while you are pregnant or before getting pregnant when possible. Also, be sure to discuss any herbs or vitamins you are taking. They are medicines, too! Discuss with your doctor all medications, prescribed and over-the-counter, that you are taking. During your prenatal visit, discuss the medications your doctor may give you during labor and delivery. N Never be afraid to ask your doctor or caregiver questions about your health, the progress of the pregnancy, family problems, stressful situations, and recommendation for a pediatrician, if you do not have one. It is better to take all precautions and discuss any questions or concerns you may have during your office visits. It is a good idea to write down your questions before you visit the doctor. O Over-the-counter cough and cold remedies may contain alcohol or other ingredients that should be avoided during pregnancy. Ask your caregiver about prescription, herbs or over-the-counter medications that you are taking or may consider taking while pregnant.  P Physical activity during pregnancy can   benefit both you and your baby by lessening discomfort and fatigue, providing a sense of well-being, and increasing the likelihood of early recovery after delivery. Light to moderate exercise during pregnancy strengthens the belly (abdominal) and back muscles. This helps improve posture. Practicing yoga, walking, swimming, and cycling on a stationary bicycle are usually safe exercises for pregnant women. Avoid scuba diving, exercise at high altitudes (over 3000 feet), skiing, horseback riding, contact sports, etc. Always check with your doctor before beginning any kind of exercise, especially during pregnancy and especially if you did not exercise before getting pregnant. Q Queasiness, stomach upset and morning sickness are common during pregnancy. Eating a couple of crackers or dry toast before getting out of bed. Foods that you normally love may  make you feel sick to your stomach. You may need to substitute other nutritious foods. Eating 5 or 6 small meals a day instead of 3 large ones may make you feel better. Do not drink with your meals, drink between meals. Questions that you have should be written down and asked during your prenatal visits. R Read about and make plans to baby-proof your home. There are important tips for making your home a safer environment for your baby. Review the tips and make your home safer for you and your baby. Read food labels regarding calories, salt and fat content in the food. S Saunas, hot tubs, and steam rooms should be avoided while you are pregnant. Excessive high heat may be harmful during your pregnancy. Your caregiver will screen and examine you for sexually transmitted diseases and genetic disorders during your prenatal visits. Learn the signs of labor. Sexual relations while pregnant is safe unless there is a medical or pregnancy problem and your caregiver advises against it. T Traveling long distances should be avoided especially in the third trimester of your pregnancy. If you do have to travel out of state, be sure to take a copy of your medical records and medical insurance plan with you. You should not travel long distances without seeing your doctor first. Most airlines will not allow you to travel after 36 weeks of pregnancy. Toxoplasmosis is an infection caused by a parasite that can seriously harm an unborn baby. Avoid eating undercooked meat and handling cat litter. Be sure to wear gloves when gardening. Tingling of the hands and fingers is not unusual and is due to fluid retention. This will go away after the baby is born. U Womb (uterus) size increases during the first trimester. Your kidneys will begin to function more efficiently. This may cause you to feel the need to urinate more often. You may also leak urine when sneezing, coughing or laughing. This is due to the growing uterus pressing  against your bladder, which lies directly in front of and slightly under the uterus during the first few months of pregnancy. If you experience burning along with frequency of urination or bloody urine, be sure to tell your doctor. The size of your uterus in the third trimester may cause a problem with your balance. It is advisable to maintain good posture and avoid wearing high heels during this time. An ultrasound of your baby may be necessary during your pregnancy and is safe for you and your baby. V Vaccinations are an important concern for pregnant women. Get needed vaccines before pregnancy. Center for Disease Control (www.cdc.gov) has clear guidelines for the use of vaccines during pregnancy. Review the list, be sure to discuss it with your doctor. Prenatal vitamins are helpful   and healthy for you and the baby. Do not take extra vitamins except what is recommended. Taking too much of certain vitamins can cause overdose problems. Continuous vomiting should be reported to your caregiver. Varicose veins may appear especially if there is a family history of varicose veins. They should subside after the delivery of the baby. Support hose helps if there is leg discomfort. W Being overweight or underweight during pregnancy may cause problems. Try to get within 15 pounds of your ideal weight before pregnancy. Remember, pregnancy is not a time to be dieting! Do not stop eating or start skipping meals as your weight increases. Both you and your baby need the calories and nutrition you receive from a healthy diet. Be sure to consult with your doctor about your diet. There is a formula and diet plan available depending on whether you are overweight or underweight. Your caregiver or nutritionist can help and advise you if necessary. X Avoid X-rays. If you must have dental work or diagnostic tests, tell your dentist or physician that you are pregnant so that extra care can be taken. X-rays should only be taken when  the risks of not taking them outweigh the risk of taking them. If needed, only the minimum amount of radiation should be used. When X-rays are necessary, protective lead shields should be used to cover areas of the body that are not being X-rayed. Y Your baby loves you. Breastfeeding your baby creates a loving and very close bond between the two of you. Give your baby a healthy environment to live in while you are pregnant. Infants and children require constant care and guidance. Their health and safety should be carefully watched at all times. After the baby is born, rest or take a nap when the baby is sleeping. Z Get your ZZZs. Be sure to get plenty of rest. Resting on your side as often as possible, especially on your left side is advised. It provides the best circulation to your baby and helps reduce swelling. Try taking a nap for 30 to 45 minutes in the afternoon when possible. After the baby is born rest or take a nap when the baby is sleeping. Try elevating your feet for that amount of time when possible. It helps the circulation in your legs and helps reduce swelling.  Most information courtesy of the CDC. Document Released: 08/10/2005 Document Revised: 07/30/2011 Document Reviewed: 04/24/2009 ExitCare Patient Information 2012 [employer]. 

## 2012-01-28 NOTE — MAU Note (Signed)
Can't make OB appt with [facility] until gets insurance. Wants to check on baby. Was told no heartbeat on ultrasound on 6/2 & wants to check on baby. No vaginal bleeding or cramping. No vaginal discharge.

## 2012-01-30 ENCOUNTER — Inpatient Hospital Stay (HOSPITAL_COMMUNITY)
Admission: AD | Admit: 2012-01-30 | Discharge: 2012-01-30 | Disposition: A | Discharge status: Home / Self Care | Payer: Medicaid Other | Source: Ambulatory Visit | Attending: Obstetrics and Gynecology | Admitting: Obstetrics and Gynecology

## 2012-01-30 ENCOUNTER — Encounter (HOSPITAL_COMMUNITY): Payer: Self-pay | Admitting: *Deleted

## 2012-01-30 DIAGNOSIS — O209 Hemorrhage in early pregnancy, unspecified: Secondary | ICD-10-CM | POA: Insufficient documentation

## 2012-01-30 LAB — WET PREP, GENITAL: Yeast Wet Prep HPF POC: NONE SEEN

## 2012-01-30 NOTE — MAU Note (Signed)
Patient was seen a few days ago  Had bleeding like a period earlier stopped patient was told had [facility], no recent intercourse, no pain patient is approximately [redacted] weeks pregnant

## 2012-02-01 ENCOUNTER — Other Ambulatory Visit: Payer: Self-pay | Admitting: Obstetrics and Gynecology

## 2012-02-01 DIAGNOSIS — O26849 Uterine size-date discrepancy, unspecified trimester: Secondary | ICD-10-CM

## 2012-02-01 LAB — GC/CHLAMYDIA PROBE AMP, GENITAL: GC Probe Amp, Genital: NEGATIVE

## 2012-02-03 ENCOUNTER — Telehealth: Payer: Self-pay | Admitting: Obstetrics and Gynecology

## 2012-02-03 ENCOUNTER — Other Ambulatory Visit: Payer: Self-pay | Admitting: Obstetrics and Gynecology

## 2012-02-03 MED ORDER — METRONIDAZOLE 500 MG PO TABS: 500.0000 mg | ORAL_TABLET | Freq: Two times a day (BID) | ORAL | Status: AC

## 2012-02-03 MED ORDER — FLORAJEN3 PO CAPS: 1.0000 | ORAL_CAPSULE | Freq: Every day | ORAL | Status: DC

## 2012-02-03 NOTE — Telephone Encounter (Signed)
Message copied by Mason Jim on Wed Feb 03, 2012 12:11 PM ------      Message from: Carolanne Grumbling H      Created: Wed Feb 03, 2012 10:34 AM       Please call pt to report wet prep in MAU Sat 01/30/12 showed BV, and call in Flagyl 500mg  po bid x7d #14, no RF, and Florajen 3 one cap po qd #30 w/ 11RF; both generic.      Thanks, [employer]

## 2012-02-03 NOTE — Telephone Encounter (Signed)
TC to pt.   Per [facility] informed of results and RX.  Pt verbalizes comprehension.

## 2012-02-05 ENCOUNTER — Encounter: Payer: Self-pay | Admitting: Obstetrics and Gynecology

## 2012-02-05 ENCOUNTER — Other Ambulatory Visit: Payer: Self-pay | Admitting: Obstetrics and Gynecology

## 2012-02-05 ENCOUNTER — Ambulatory Visit (INDEPENDENT_AMBULATORY_CARE_PROVIDER_SITE_OTHER): Payer: Medicaid Other

## 2012-02-05 ENCOUNTER — Ambulatory Visit (INDEPENDENT_AMBULATORY_CARE_PROVIDER_SITE_OTHER): Payer: Medicaid Other | Admitting: Obstetrics and Gynecology

## 2012-02-05 VITALS — BP 102/60 | Ht 65.5 in | Wt 178.0 lb

## 2012-02-05 DIAGNOSIS — O26849 Uterine size-date discrepancy, unspecified trimester: Secondary | ICD-10-CM

## 2012-02-05 DIAGNOSIS — Z331 Pregnant state, incidental: Secondary | ICD-10-CM

## 2012-02-05 DIAGNOSIS — N926 Irregular menstruation, unspecified: Secondary | ICD-10-CM

## 2012-02-05 NOTE — Progress Notes (Signed)
Pt had U/S for viability today Fetal Heart tone was 144. U/A was Neg for Glucose and Neg for Protein JM

## 2012-02-05 NOTE — Progress Notes (Signed)
20 YO with history of Polycystic Ovarian Syndrome and who is S/P SAB presents after discovering that she's pregnant for a dating/viability ultrasound. (LMP 12/06/11)   O:  U/S= 7 w 2 d gestation/SIUP with FHR=144 bpm   A: SIUP 7 w 2 d  P: Schedule NOB exam (has had an interview within the past      2 years      Patient had questions about and had not taken meds pre-     scribed by MAU for BV and a probiotic.  Explained the in-     dication for both.  Patient to begin the probiotic right away     but wait until 12 weeks to begin the Metronidazole.  Aware     that BV may cause pre-term labor.   Keidan Aumiller, PA-C

## 2012-02-09 ENCOUNTER — Telehealth: Payer: Self-pay | Admitting: Obstetrics and Gynecology

## 2012-02-09 NOTE — Telephone Encounter (Signed)
Triage/cht received 

## 2012-02-10 ENCOUNTER — Encounter: Payer: Self-pay | Admitting: Obstetrics and Gynecology

## 2012-02-11 ENCOUNTER — Encounter (HOSPITAL_COMMUNITY): Payer: Self-pay | Admitting: *Deleted

## 2012-02-11 ENCOUNTER — Inpatient Hospital Stay (HOSPITAL_COMMUNITY): Payer: PRIVATE HEALTH INSURANCE

## 2012-02-11 ENCOUNTER — Inpatient Hospital Stay (HOSPITAL_COMMUNITY)
Admission: AD | Admit: 2012-02-11 | Discharge: 2012-02-11 | Disposition: A | Discharge status: Home / Self Care | Payer: PRIVATE HEALTH INSURANCE | Source: Ambulatory Visit | Attending: Obstetrics and Gynecology | Admitting: Obstetrics and Gynecology

## 2012-02-11 ENCOUNTER — Other Ambulatory Visit: Payer: Self-pay | Admitting: Obstetrics and Gynecology

## 2012-02-11 DIAGNOSIS — O209 Hemorrhage in early pregnancy, unspecified: Secondary | ICD-10-CM

## 2012-02-11 DIAGNOSIS — N898 Other specified noninflammatory disorders of vagina: Secondary | ICD-10-CM

## 2012-02-11 DIAGNOSIS — O200 Threatened abortion: Secondary | ICD-10-CM

## 2012-02-11 LAB — CBC
Hemoglobin: 12.3 g/dL (ref 12.0–15.0)
MCH: 30 pg (ref 26.0–34.0)
MCHC: 33.6 g/dL (ref 30.0–36.0)
MCV: 89.3 fL (ref 78.0–100.0)
Platelets: 241 10*3/uL (ref 150–400)
RBC: 4.1 MIL/uL (ref 3.87–5.11)

## 2012-02-11 LAB — DIFFERENTIAL
Basophils Relative: 0 % (ref 0–1)
Eosinophils Absolute: 0.1 10*3/uL (ref 0.0–0.7)
Eosinophils Relative: 1 % (ref 0–5)
Lymphs Abs: 1.5 10*3/uL (ref 0.7–4.0)
Monocytes Relative: 11 % (ref 3–12)

## 2012-02-11 MED ORDER — LACTATED RINGERS IV SOLN: INTRAVENOUS | Status: DC

## 2012-02-11 NOTE — MAU Note (Signed)
Hx of spotting for a couple wk, started around wk 5/6

## 2012-02-11 NOTE — Discharge Instructions (Signed)
Threatened Miscarriage  A threatened miscarriage is a pregnancy that may end. It may be marked by bleeding during the first 20 weeks of pregnancy. Often, the pregnancy can continue without any more problems. You may be asked to stop:  Having sex (intercourse).   Having orgasms.   Using tampons.   Exercising.   Doing heavy physical activity and work.  HOME CARE   Your doctor may tell you to take bed rest and to stop activities and work.   Write down the number of pads you use each day. Write down how often you change pads. Write down how soaked they are.   Follow your doctor's advice for follow-up visits and tests.   If your blood type is Rh-negative and the father's blood is Rh-positive (or is not known), you may get a shot to protect the baby.   If you have a miscarriage, save all the tissue you pass in a container. Take the container to your doctor.  GET HELP RIGHT AWAY IF:   You have bad cramps or pain in your belly (abdomen), lower belly, or back.   You have a fever or chills.   Your bleeding gets worse or you pass large clots of blood or tissue. Save this tissue to show your doctor.   You feel lightheaded, weak, dizzy, or pass out (faint).   You have a gush of fluid from your vagina.  MAKE SURE YOU:   Understand these instructions.   Will watch your condition.   Will get help right away if you are not doing well or get worse.  Document Released: 07/23/2008 Document Revised: 07/30/2011 Document Reviewed: 08/26/2009 Exit[employer] Patient Information 2012 [employer]. 

## 2012-02-11 NOTE — MAU Note (Signed)
Has been seen with the preg.  Bleeding started this morning. Has had blood work and [place]. Last [place] was Fr, every viable IUP.  Light cramping in upper abd after bleeding.

## 2012-02-11 NOTE — Progress Notes (Signed)
[place] with [facility] +[facility] discussed, s/s bleeding, pain to report reveiwed, f/o as scheduled. Lavera Guise, CNM

## 2012-02-11 NOTE — Progress Notes (Signed)
History   presents with c/o of soaked a large ob pad after using the bathroom bleeding seems to occur, slight cramping.   Chief Complaint  Patient presents with  . Vaginal Bleeding   @SFHPI @  OB History    Grav Para Term Preterm Abortions TAB SAB Ect Mult Living   2    1  1    0      Past Medical History  Diagnosis Date  . No pertinent past medical history   . PCOS (polycystic ovarian syndrome)   . Miscarriage     Past Surgical History  Procedure Date  . Dilation and evacuation 06/18/2011    Procedure: DILATATION AND EVACUATION (D&E);  Surgeon: Hal Morales, MD;  Location: [facility];  Service: Gynecology;  Laterality: N/A;    Family History  Problem Relation Age of Onset  . Arthritis Mother   . Diabetes Maternal Aunt   . Diabetes Maternal Grandmother   . Cancer Maternal Grandmother     BREAST; POST MENOPAUSAL  . Hyperlipidemia Maternal Grandmother   . Hypertension Maternal Grandmother   . Anesthesia problems Neg Hx   . Sickle cell trait Father   . Sickle cell trait Sister   . Diabetes Maternal Uncle   . Kidney disease Maternal Uncle     DIALYSIS  . Alcohol abuse Maternal Uncle   . Drug abuse Maternal Uncle     History  Substance Use Topics  . Smoking status: Never Smoker   . Smokeless tobacco: Never Used  . Alcohol Use: No    Allergies: No Known Allergies  Prescriptions prior to admission  Medication Sig Dispense Refill  . metroNIDAZOLE (FLAGYL) 500 MG tablet Take 1 tablet (500 mg total) by mouth 2 (two) times daily.  14 tablet  0  . Prenatal Vit-Fe Fumarate-FA (PRENATAL MULTIVITAMIN) TABS Take 1 tablet by mouth daily.      . Probiotic Product [facility]) CAPS Take 1 capsule by mouth daily.  30 capsule  11    @ROS @ Physical Exam   Blood pressure 125/72, pulse 73, temperature 99.5 F (37.5 C), temperature source Oral, resp. rate 20, height 5\' 5"  (1.651 m), weight 177 lb (80.287 kg), last menstrual period 12/06/2011. Alert cheerful no distress abd  soft, nt on palpation EGBUS WNL with 1 cm clot SSE Cervix pink moist, no additional bleeding seen. Digital exam cervix LTC A 8 week IUP vaginal bleeding appears resolved. p [place] now. Lavera Guise, CNM Addendum: [place] with [facility], [facility] discussed s/s bleeding, pain to report, f/o as scheduled, collaboration with Dr. Pennie Rushing per telephone. Lavera Guise, CNM

## 2012-02-12 LAB — US OB TRANSVAGINAL

## 2012-02-18 ENCOUNTER — Telehealth: Payer: Self-pay | Admitting: Obstetrics and Gynecology

## 2012-02-18 NOTE — Telephone Encounter (Signed)
Discussed measure to avoid constipation water, fruit, vegetables, fiber, mirilax, common discomforts of pg, use of motrin to 28 week, ice pack to back, denies UTI s/s has had pg [place], bleeding to report, report if sx change or unresolved, discusssed concern of pt for appendicitis s/s V,D, fever, pain to report. States will be able to sleep. Lavera Guise, CNM

## 2012-02-22 ENCOUNTER — Encounter: Payer: Self-pay | Admitting: Obstetrics and Gynecology

## 2012-03-10 ENCOUNTER — Encounter: Payer: Self-pay | Admitting: Obstetrics and Gynecology

## 2012-03-10 ENCOUNTER — Ambulatory Visit (INDEPENDENT_AMBULATORY_CARE_PROVIDER_SITE_OTHER): Payer: Medicaid Other | Admitting: Obstetrics and Gynecology

## 2012-03-10 VITALS — BP 100/60 | Wt 177.0 lb

## 2012-03-10 DIAGNOSIS — Z124 Encounter for screening for malignant neoplasm of cervix: Secondary | ICD-10-CM

## 2012-03-10 DIAGNOSIS — Z331 Pregnant state, incidental: Secondary | ICD-10-CM

## 2012-03-10 LAB — POCT URINALYSIS DIPSTICK
Bilirubin, UA: NEGATIVE
Ketones, UA: NEGATIVE
Leukocytes, UA: NEGATIVE

## 2012-03-10 NOTE — Progress Notes (Signed)
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 Pt still having bleeding but is easing up a little. C/o low right side pain Gc/ct neg 01/30/12

## 2012-03-10 NOTE — Progress Notes (Signed)
Subjective:    Robin [name] is being seen today for her first obstetrical visit at [redacted]w[redacted]d gestation by 1st trimester [place].  She has been seen for 1st trimester bleeding, which has now resolved to a brown d/c with no pain.  Declines genetic testing.  She reports no other issues.  Her obstetrical history is significant for: Patient Active Problem List  Diagnosis  . Missed abortion  . First trimester bleeding  . PCOS (polycystic ovarian syndrome)    Relationship with FOB:  Partner, present with her.  Patient does intend to breast feed.   Pregnancy history fully reviewed.  The following portions of the patient's history were reviewed and updated as appropriate: allergies, current medications, past family history, past medical history, past social history, past surgical history and problem list.  Review of Systems Pertinent ROS is described in HPI   Objective:   BP 100/60  LMP 12/06/2011  Breastfeeding? Unknown Wt Readings from Last 1 Encounters:  02/11/12 177 lb (80.287 kg)   BMI: There is no height or weight on file to calculate BMI.  General: alert, cooperative and no distress Respiratory: clear to auscultation bilaterally Cardiovascular: regular rate and rhythm, S1, S2 normal, no murmur Breasts:  No dominant masses, nipples erect Gastrointestinal: soft, non-tender; no masses,  no organomegaly Extremities: extremities normal, no pain or edema Vaginal Bleeding: None  EXTERNAL GENITALIA: normal appearing vulva with no masses, tenderness or lesions VAGINA: no abnormal discharge or lesions CERVIX: no lesions or cervical motion tenderness; cervix closed, long, firm UTERUS: gravid and consistent with 12 weeks ADNEXA: no masses palpable and nontender OB EXAM PELVIMETRY: appears adequate   FHR:  160  bpm  Assessment:    Pregnancy at 12 weeks by early [place] 1st trimester bleeding, now resolving Plan:     Prenatal panel reviewed and discussed with the patient: Drawn  today. Pap smear collected:no, due to early bleeding--will do later in pregnancy GC/Chlamydia collected: Done early in pregnancy, negative 01/30/12 Wet prep: NA Discussion of Genetic testing options: Declines Prenatal vitamins recommended Problem list reviewed and updated.  Plan of care: Follow up in 4 weeks for ROB visit.  Nigel Bridgeman CNM, [place] 03/10/2012 10:12 AM

## 2012-03-11 LAB — PRENATAL PANEL VII
Basophils Relative: 0 % (ref 0–1)
Eosinophils Absolute: 0.1 10*3/uL (ref 0.0–0.7)
Hepatitis B Surface Ag: NEGATIVE
MCH: 30 pg (ref 26.0–34.0)
MCHC: 32.6 g/dL (ref 30.0–36.0)
Monocytes Relative: 8 % (ref 3–12)
Neutrophils Relative %: 68 % (ref 43–77)
Platelets: 240 10*3/uL (ref 150–400)
RDW: 15.3 % (ref 11.5–15.5)

## 2012-03-14 LAB — HEMOGLOBINOPATHY EVALUATION
Hgb A2 Quant: 3.1 % (ref 2.2–3.2)
Hgb A: 96.9 % (ref 96.8–97.8)
Hgb F Quant: 0 % (ref 0.0–2.0)
Hgb S Quant: 0 %

## 2012-04-03 ENCOUNTER — Inpatient Hospital Stay (HOSPITAL_COMMUNITY)
Admission: AD | Admit: 2012-04-03 | Discharge: 2012-04-03 | Disposition: A | Discharge status: Home / Self Care | Payer: Medicaid Other | Source: Ambulatory Visit | Attending: Obstetrics and Gynecology | Admitting: Obstetrics and Gynecology

## 2012-04-03 ENCOUNTER — Encounter (HOSPITAL_COMMUNITY): Payer: Self-pay

## 2012-04-03 DIAGNOSIS — Z331 Pregnant state, incidental: Secondary | ICD-10-CM

## 2012-04-03 DIAGNOSIS — O99891 Other specified diseases and conditions complicating pregnancy: Secondary | ICD-10-CM | POA: Insufficient documentation

## 2012-04-03 DIAGNOSIS — R109 Unspecified abdominal pain: Secondary | ICD-10-CM | POA: Insufficient documentation

## 2012-04-03 DIAGNOSIS — N949 Unspecified condition associated with female genital organs and menstrual cycle: Secondary | ICD-10-CM

## 2012-04-03 DIAGNOSIS — N760 Acute vaginitis: Secondary | ICD-10-CM

## 2012-04-03 LAB — URINALYSIS, ROUTINE W REFLEX MICROSCOPIC
Glucose, UA: NEGATIVE mg/dL
Leukocytes, UA: NEGATIVE
Protein, ur: NEGATIVE mg/dL
Urobilinogen, UA: 0.2 mg/dL (ref 0.0–1.0)

## 2012-04-03 LAB — WET PREP, GENITAL

## 2012-04-03 MED ORDER — IBUPROFEN 800 MG PO TABS
800.0000 mg | ORAL_TABLET | Freq: Once | ORAL | Status: AC
  Administered 2012-04-03: 800 mg via ORAL
  Filled 2012-04-03: qty 1

## 2012-04-03 MED ORDER — TERCONAZOLE 0.4 % VA CREA: 1.0000 | TOPICAL_CREAM | Freq: Every day | VAGINAL | Status: AC

## 2012-04-03 MED ORDER — NYSTATIN-TRIAMCINOLONE 100000-0.1 UNIT/GM-% EX OINT: TOPICAL_OINTMENT | Freq: Two times a day (BID) | CUTANEOUS | Status: DC | PRN

## 2012-04-03 MED ORDER — IBUPROFEN 600 MG PO TABS: 600.0000 mg | ORAL_TABLET | Freq: Four times a day (QID) | ORAL | Status: AC | PRN

## 2012-04-03 NOTE — MAU Note (Signed)
Patient given graham crackers, peanut butter and apple juice. 

## 2012-04-03 NOTE — MAU Note (Signed)
Report given to [facility] CNM

## 2012-04-03 NOTE — MAU Note (Signed)
Onset of lower abdominal pain since last night around 10:00 p.m. Hurts to walk a lot of pressure, having nausea. 15 weeks

## 2012-04-03 NOTE — MAU Provider Note (Signed)
History   Robin [name] is a 21y.o. SBF who presents unannounced at [redacted]w[redacted]d with CC of lower abdominal pain And pelvic "pressure" since last night.  Reports pain kept her up some overnight.  Worse w/ walking and had to "waddle" into MAU.  Reports urinary frequency, but no other UTI s/s.  Reports increased d/c recently w/ external irritation, but not consistent on a daily basis.  No recent VB.  Occ'l nausea.  Works in [employer] ge[employer] and worked 8 hrs yesterday; did not get adequate water yesterday.  Drank a glass of water on way here this morning but no solid food.  Occ'l "tightenings" in lower abdomen.  Occ'l back pain.   No recent fever, resp, or GI c/o'.s other than occ'l nausea. CSN: [id number]  Arrival date and time: 04/03/12 4098   First Provider Initiated Contact with Patient 04/03/12 [phone]      Chief Complaint  Patient presents with  . Abdominal Pain  . Morning Sickness   HPI  OB History    Grav Para Term Preterm Abortions TAB SAB Ect Mult Living   2    1  1    0      Past Medical History  Diagnosis Date  . No pertinent past medical history   . PCOS (polycystic ovarian syndrome)   . Miscarriage     Past Surgical History  Procedure Date  . Dilation and evacuation 06/18/2011    Procedure: DILATATION AND EVACUATION (D&E);  Surgeon: Hal Morales, MD;  Location: [facility];  Service: Gynecology;  Laterality: N/A;    Family History  Problem Relation Age of Onset  . Arthritis Mother   . Diabetes Maternal Aunt   . Diabetes Maternal Grandmother   . Cancer Maternal Grandmother     BREAST; POST MENOPAUSAL  . Hyperlipidemia Maternal Grandmother   . Hypertension Maternal Grandmother   . Anesthesia problems Neg Hx   . Sickle cell trait Father   . Sickle cell trait Sister   . Diabetes Maternal Uncle   . Kidney disease Maternal Uncle     DIALYSIS  . Alcohol abuse Maternal Uncle   . Drug abuse Maternal Uncle     History  Substance Use Topics  . Smoking status: Never Smoker   .  Smokeless tobacco: Never Used  . Alcohol Use: No    Allergies: No Known Allergies  Prescriptions prior to admission  Medication Sig Dispense Refill  . Prenatal Vit-Fe Fumarate-FA (PRENATAL MULTIVITAMIN) TABS Take 1 tablet by mouth daily.        ROS--see HPI above Physical Exam   Blood pressure 106/72, pulse 81, temperature 98.6 F (37 C), temperature source Oral, resp. rate 16, height 5\' 6"  (1.676 m), weight 179 lb (81.194 kg), last menstrual period 12/06/2011, unknown if currently breastfeeding. .. Results for orders placed during the hospital encounter of 04/03/12 (from the past 24 hour(s))  URINALYSIS, ROUTINE W REFLEX MICROSCOPIC     Status: Abnormal   Collection Time   04/03/12  7:08 AM      Component Value Range   Color, Urine YELLOW  YELLOW   APPearance HAZY (*) CLEAR   Specific Gravity, Urine 1.010  1.005 - 1.030   pH 7.0  5.0 - 8.0   Glucose, UA NEGATIVE  NEGATIVE mg/dL   Hgb urine dipstick NEGATIVE  NEGATIVE   Bilirubin Urine NEGATIVE  NEGATIVE   Ketones, ur NEGATIVE  NEGATIVE mg/dL   Protein, ur NEGATIVE  NEGATIVE mg/dL   Urobilinogen, UA 0.2  0.0 -  1.0 mg/dL   Nitrite NEGATIVE  NEGATIVE   Leukocytes, UA NEGATIVE  NEGATIVE  WET PREP, GENITAL     Status: Abnormal   Collection Time   04/03/12  9:00 AM      Component Value Range   Yeast Wet Prep HPF POC FEW (*) NONE SEEN   Trich, Wet Prep NONE SEEN  NONE SEEN   Clue Cells Wet Prep HPF POC FEW (*) NONE SEEN   WBC, Wet Prep HPF POC FEW (*) NONE SEEN   Physical Exam  Constitutional: She is oriented to person, place, and time. She appears well-developed and well-nourished. No distress.  HENT:  Head: Normocephalic.  Eyes: Pupils are equal, round, and reactive to light.  Cardiovascular: Normal rate.   Respiratory: Effort normal.  GI: Soft.       Gravid; pos FHT per RN; s=d  Genitourinary:       SSE:  No VB; anterior cx; slightly narrow outlet; cx:  Firm, long/closed. Small amt of white d/c in vault.    Neurological: She is alert and oriented to person, place, and time.  Skin: Skin is warm and dry.  Psychiatric: Her behavior is normal. Judgment and thought content normal.    MAU Course  Procedures 1.  Wet prep 2.  Gc/ct 3. Motrin 800mg  po x1 4.  u/a  Assessment and Plan  1. [redacted]w[redacted]d 2.  U/a WNL 3.  Few yeast and few clue on wet prep 4.  Probably round ligament pains 5.  Pap due--consider in Dec in birthday month  1.  D/c home w/ pain precautions and OTC measured disc'd. 2.  Keep appt this week for early gtt and ROB, or f/u prn 3.  disc'd adeq water intake, small frequent meals 4.  E-prescribed, Motrin for prn use, Mycolog II prn, and Terazol 7 prn if symptoms worsen   Hurman Ketelsen H 04/03/2012, 9:46 AM

## 2012-04-07 ENCOUNTER — Ambulatory Visit (INDEPENDENT_AMBULATORY_CARE_PROVIDER_SITE_OTHER): Payer: Medicaid Other | Admitting: Obstetrics and Gynecology

## 2012-04-07 ENCOUNTER — Encounter: Payer: Self-pay | Admitting: Obstetrics and Gynecology

## 2012-04-07 VITALS — BP 98/56 | Wt 178.5 lb

## 2012-04-07 DIAGNOSIS — Z3689 Encounter for other specified antenatal screening: Secondary | ICD-10-CM

## 2012-04-07 DIAGNOSIS — Z331 Pregnant state, incidental: Secondary | ICD-10-CM

## 2012-04-07 NOTE — Progress Notes (Signed)
[redacted]w[redacted]d Complains of tender breasts.  No discharge or bleeding.  Exam is completely normal. Declines genetic testing. Anatomy ultrasound next visit. Return office in 4 weeks. Dr. Stefano Gaul

## 2012-04-07 NOTE — Progress Notes (Signed)
Pt requests breast exam today.

## 2012-04-22 ENCOUNTER — Ambulatory Visit (INDEPENDENT_AMBULATORY_CARE_PROVIDER_SITE_OTHER): Payer: Medicaid Other

## 2012-04-22 ENCOUNTER — Ambulatory Visit (INDEPENDENT_AMBULATORY_CARE_PROVIDER_SITE_OTHER): Payer: Medicaid Other | Admitting: Obstetrics and Gynecology

## 2012-04-22 ENCOUNTER — Encounter: Payer: Self-pay | Admitting: Obstetrics and Gynecology

## 2012-04-22 VITALS — BP 102/60 | Wt 178.0 lb

## 2012-04-22 DIAGNOSIS — Z3689 Encounter for other specified antenatal screening: Secondary | ICD-10-CM

## 2012-04-22 DIAGNOSIS — Z3480 Encounter for supervision of other normal pregnancy, unspecified trimester: Secondary | ICD-10-CM

## 2012-04-22 NOTE — Progress Notes (Signed)
Pt states no concern today.

## 2012-04-22 NOTE — Progress Notes (Signed)
S=D cx 3.44 cm, flpk 4.2cm, breech presentation Normal anatomy the heart views were not well seen Normal adnexa Pt without c/o F/u US for anatomy @[facility] 

## 2012-04-29 LAB — US OB COMP + 14 WK

## 2012-05-20 ENCOUNTER — Ambulatory Visit (INDEPENDENT_AMBULATORY_CARE_PROVIDER_SITE_OTHER): Payer: Medicaid Other

## 2012-05-20 ENCOUNTER — Encounter: Payer: Self-pay | Admitting: Obstetrics and Gynecology

## 2012-05-20 ENCOUNTER — Other Ambulatory Visit: Payer: Self-pay | Admitting: Obstetrics and Gynecology

## 2012-05-20 ENCOUNTER — Ambulatory Visit (INDEPENDENT_AMBULATORY_CARE_PROVIDER_SITE_OTHER): Payer: Medicaid Other | Admitting: Obstetrics and Gynecology

## 2012-05-20 VITALS — BP 110/62 | Wt 184.0 lb

## 2012-05-20 DIAGNOSIS — Z331 Pregnant state, incidental: Secondary | ICD-10-CM

## 2012-05-20 DIAGNOSIS — Z3480 Encounter for supervision of other normal pregnancy, unspecified trimester: Secondary | ICD-10-CM

## 2012-05-20 DIAGNOSIS — Z3490 Encounter for supervision of normal pregnancy, unspecified, unspecified trimester: Secondary | ICD-10-CM

## 2012-05-20 DIAGNOSIS — O358XX0 Maternal care for other (suspected) fetal abnormality and damage, not applicable or unspecified: Secondary | ICD-10-CM

## 2012-05-20 NOTE — Progress Notes (Signed)
[redacted]w[redacted]d Ultrasound there is a followup anatomy and estimated fetal weight of 1 lb. 2 oz. 4% cervix 3.2 cm all cardiac use was seen today and no abnormalities noted, normal fluid anterior placenta cervix is closed. No complaints Questions answered Wants WB  - rec sched next appt with CNM to discuss

## 2012-06-16 ENCOUNTER — Ambulatory Visit (INDEPENDENT_AMBULATORY_CARE_PROVIDER_SITE_OTHER): Payer: Medicaid Other | Admitting: Obstetrics and Gynecology

## 2012-06-16 ENCOUNTER — Other Ambulatory Visit: Payer: Medicaid Other

## 2012-06-16 VITALS — BP 110/62 | Wt 189.0 lb

## 2012-06-16 DIAGNOSIS — Z139 Encounter for screening, unspecified: Secondary | ICD-10-CM

## 2012-06-16 DIAGNOSIS — Z331 Pregnant state, incidental: Secondary | ICD-10-CM

## 2012-06-16 LAB — CBC
Hemoglobin: 11.3 g/dL — ABNORMAL LOW (ref 12.0–15.0)
RBC: 3.59 MIL/uL — ABNORMAL LOW (ref 3.87–5.11)
WBC: 8.4 10*3/uL (ref 4.0–10.5)

## 2012-06-16 NOTE — Progress Notes (Signed)
[redacted]w[redacted]d Pt has no complaints today Pt declines Flu Vaccine 1hr Glucola today

## 2012-06-16 NOTE — Patient Instructions (Signed)
Fetal Movement Counts Patient Name: __________________________________________________ Patient Due Date: ____________________ Kick counts is highly recommended in high risk pregnancies, but it is a good idea for every pregnant woman to do. Start counting fetal movements at 28 weeks of the pregnancy. Fetal movements increase after eating a full meal or eating or drinking something sweet (the blood sugar is higher). It is also important to drink plenty of fluids (well hydrated) before doing the count. Lie on your left side because it helps with the circulation or you can sit in a comfortable chair with your arms over your belly (abdomen) with no distractions around you. DOING THE COUNT  Try to do the count the same time of day each time you do it.  Mark the day and time, then see how long it takes for you to feel 10 movements (kicks, flutters, swishes, rolls). You should have at least 10 movements within 2 hours. You will most likely feel 10 movements in much less than 2 hours. If you do not, wait an hour and count again. After a couple of days you will see a pattern.  What you are looking for is a change in the pattern or not enough counts in 2 hours. Is it taking longer in time to reach 10 movements? SEEK MEDICAL CARE IF:  You feel less than 10 counts in 2 hours. Tried twice.  No movement in one hour.  The pattern is changing or taking longer each day to reach 10 counts in 2 hours.  You feel the baby is not moving as it usually does. Date: ____________ Movements: ____________ Start time: ____________ Finish time: ____________  Date: ____________ Movements: ____________ Start time: ____________ Finish time: ____________ Date: ____________ Movements: ____________ Start time: ____________ Finish time: ____________ Date: ____________ Movements: ____________ Start time: ____________ Finish time: ____________ Date: ____________ Movements: ____________ Start time: ____________ Finish time:  ____________ Date: ____________ Movements: ____________ Start time: ____________ Finish time: ____________ Date: ____________ Movements: ____________ Start time: ____________ Finish time: ____________ Date: ____________ Movements: ____________ Start time: ____________ Finish time: ____________  Date: ____________ Movements: ____________ Start time: ____________ Finish time: ____________ Date: ____________ Movements: ____________ Start time: ____________ Finish time: ____________ Date: ____________ Movements: ____________ Start time: ____________ Finish time: ____________ Date: ____________ Movements: ____________ Start time: ____________ Finish time: ____________ Date: ____________ Movements: ____________ Start time: ____________ Finish time: ____________ Date: ____________ Movements: ____________ Start time: ____________ Finish time: ____________ Date: ____________ Movements: ____________ Start time: ____________ Finish time: ____________  Date: ____________ Movements: ____________ Start time: ____________ Finish time: ____________ Date: ____________ Movements: ____________ Start time: ____________ Finish time: ____________ Date: ____________ Movements: ____________ Start time: ____________ Finish time: ____________ Date: ____________ Movements: ____________ Start time: ____________ Finish time: ____________ Date: ____________ Movements: ____________ Start time: ____________ Finish time: ____________ Date: ____________ Movements: ____________ Start time: ____________ Finish time: ____________ Date: ____________ Movements: ____________ Start time: ____________ Finish time: ____________  Date: ____________ Movements: ____________ Start time: ____________ Finish time: ____________ Date: ____________ Movements: ____________ Start time: ____________ Finish time: ____________ Date: ____________ Movements: ____________ Start time: ____________ Finish time: ____________ Date: ____________ Movements:  ____________ Start time: ____________ Finish time: ____________ Date: ____________ Movements: ____________ Start time: ____________ Finish time: ____________ Date: ____________ Movements: ____________ Start time: ____________ Finish time: ____________ Date: ____________ Movements: ____________ Start time: ____________ Finish time: ____________  Date: ____________ Movements: ____________ Start time: ____________ Finish time: ____________ Date: ____________ Movements: ____________ Start time: ____________ Finish time: ____________ Date: ____________ Movements: ____________ Start time: ____________ Finish time: ____________ Date: ____________ Movements:   ____________ Start time: ____________ Finish time: ____________ Date: ____________ Movements: ____________ Start time: ____________ Finish time: ____________ Date: ____________ Movements: ____________ Start time: ____________ Finish time: ____________ Date: ____________ Movements: ____________ Start time: ____________ Finish time: ____________  Date: ____________ Movements: ____________ Start time: ____________ Finish time: ____________ Date: ____________ Movements: ____________ Start time: ____________ Finish time: ____________ Date: ____________ Movements: ____________ Start time: ____________ Finish time: ____________ Date: ____________ Movements: ____________ Start time: ____________ Finish time: ____________ Date: ____________ Movements: ____________ Start time: ____________ Finish time: ____________ Date: ____________ Movements: ____________ Start time: ____________ Finish time: ____________ Date: ____________ Movements: ____________ Start time: ____________ Finish time: ____________  Date: ____________ Movements: ____________ Start time: ____________ Finish time: ____________ Date: ____________ Movements: ____________ Start time: ____________ Finish time: ____________ Date: ____________ Movements: ____________ Start time: ____________ Finish  time: ____________ Date: ____________ Movements: ____________ Start time: ____________ Finish time: ____________ Date: ____________ Movements: ____________ Start time: ____________ Finish time: ____________ Date: ____________ Movements: ____________ Start time: ____________ Finish time: ____________ Date: ____________ Movements: ____________ Start time: ____________ Finish time: ____________  Date: ____________ Movements: ____________ Start time: ____________ Finish time: ____________ Date: ____________ Movements: ____________ Start time: ____________ Finish time: ____________ Date: ____________ Movements: ____________ Start time: ____________ Finish time: ____________ Date: ____________ Movements: ____________ Start time: ____________ Finish time: ____________ Date: ____________ Movements: ____________ Start time: ____________ Finish time: ____________ Date: ____________ Movements: ____________ Start time: ____________ Finish time: ____________ Document Released: 09/09/2006 Document Revised: 11/02/2011 Document Reviewed: 03/12/2009 [employer] Patient Information 2013 [employer].  Preventing Preterm Labor Preterm labor is when a pregnant woman has contractions that cause the cervix to open, shorten, and thin before 37 weeks of pregnancy. You will have regular contractions (tightening) 2 to 3 minutes apart. This usually causes discomfort or pain. HOME CARE  Eat a healthy diet.  Take your vitamins as told by your doctor.  Drink enough fluids to keep your pee (urine) clear or pale yellow every day.  Get rest and sleep.  Do not have sex if you are at high risk for preterm labor.  Follow your doctor's advice about activity, medicines, and tests.  Avoid stress.  Avoid hard labor or exercise that lasts for a long time.  Do not smoke. GET HELP RIGHT AWAY IF:   You are having contractions.  You have belly (abdominal) pain.  You have bleeding from your vagina.  You have pain when  you pee (urinate).  You have abnormal discharge from your vagina.  You have a temperature by mouth above 102 F (38.9 C). MAKE SURE YOU:  Understand these instructions.  Will watch your condition.  Will get help if you are not doing well or get worse. Document Released: 11/06/2008 Document Revised: 11/02/2011 Document Reviewed: 11/06/2008 [employer] Patient Information 2013 [employer].  

## 2012-06-16 NOTE — Progress Notes (Signed)
[redacted]w[redacted]d Doing well, some RLP, rv'd comfort measures CBE, circ rv'd Considering WB ?re: contraception, OCP or condoms

## 2012-06-17 ENCOUNTER — Other Ambulatory Visit: Payer: Medicaid Other

## 2012-06-17 ENCOUNTER — Encounter: Payer: Medicaid Other | Admitting: Obstetrics and Gynecology

## 2012-06-17 LAB — GLUCOSE TOLERANCE, 1 HOUR (50G) W/O FASTING: Glucose, 1 Hour GTT: 63 mg/dL — ABNORMAL LOW (ref 70–140)

## 2012-06-17 LAB — RPR

## 2012-06-27 ENCOUNTER — Ambulatory Visit (INDEPENDENT_AMBULATORY_CARE_PROVIDER_SITE_OTHER): Payer: Medicaid Other | Admitting: Obstetrics and Gynecology

## 2012-06-27 VITALS — BP 110/60 | Wt 190.0 lb

## 2012-06-27 DIAGNOSIS — Z331 Pregnant state, incidental: Secondary | ICD-10-CM

## 2012-06-27 NOTE — Progress Notes (Signed)
  [redacted]w[redacted]d 1 gtt wnl  Plans childbirth classes, nexplanon Reviewed s/s preterm labor, srom, vag bleeding, kick counts to report, enc 8 water daily and frequent voids. Lavera Guise, CNM

## 2012-06-27 NOTE — Progress Notes (Signed)
Pt stated no issues today.  

## 2012-07-11 ENCOUNTER — Ambulatory Visit (INDEPENDENT_AMBULATORY_CARE_PROVIDER_SITE_OTHER): Payer: Medicaid Other | Admitting: Obstetrics and Gynecology

## 2012-07-11 VITALS — BP 110/62 | Wt 192.0 lb

## 2012-07-11 DIAGNOSIS — Z23 Encounter for immunization: Secondary | ICD-10-CM

## 2012-07-11 DIAGNOSIS — J111 Influenza due to unidentified influenza virus with other respiratory manifestations: Secondary | ICD-10-CM | POA: Insufficient documentation

## 2012-07-11 DIAGNOSIS — Z3480 Encounter for supervision of other normal pregnancy, unspecified trimester: Secondary | ICD-10-CM

## 2012-07-11 NOTE — Progress Notes (Signed)
[redacted]w[redacted]d No complaints today Flu vaccine today

## 2012-07-25 ENCOUNTER — Encounter: Payer: Self-pay | Admitting: Obstetrics and Gynecology

## 2012-07-25 ENCOUNTER — Ambulatory Visit (INDEPENDENT_AMBULATORY_CARE_PROVIDER_SITE_OTHER): Payer: Medicaid Other | Admitting: Obstetrics and Gynecology

## 2012-07-25 VITALS — BP 108/56 | Wt 193.0 lb

## 2012-07-25 DIAGNOSIS — Z331 Pregnant state, incidental: Secondary | ICD-10-CM

## 2012-07-25 NOTE — Progress Notes (Signed)
[redacted]w[redacted]d Pt declines cx exam.  States the pains have stopped [facility] reviewed

## 2012-07-25 NOTE — Patient Instructions (Signed)
Fetal Movement Counts Patient Name: __________________________________________________ Patient Due Date: ____________________ Kick counts is highly recommended in high risk pregnancies, but it is a good idea for every pregnant woman to do. Start counting fetal movements at 28 weeks of the pregnancy. Fetal movements increase after eating a full meal or eating or drinking something sweet (the blood sugar is higher). It is also important to drink plenty of fluids (well hydrated) before doing the count. Lie on your left side because it helps with the circulation or you can sit in a comfortable chair with your arms over your belly (abdomen) with no distractions around you. DOING THE COUNT  Try to do the count the same time of day each time you do it.  Mark the day and time, then see how long it takes for you to feel 10 movements (kicks, flutters, swishes, rolls). You should have at least 10 movements within 2 hours. You will most likely feel 10 movements in much less than 2 hours. If you do not, wait an hour and count again. After a couple of days you will see a pattern.  What you are looking for is a change in the pattern or not enough counts in 2 hours. Is it taking longer in time to reach 10 movements? SEEK MEDICAL CARE IF:  You feel less than 10 counts in 2 hours. Tried twice.  No movement in one hour.  The pattern is changing or taking longer each day to reach 10 counts in 2 hours.  You feel the baby is not moving as it usually does. Date: ____________ Movements: ____________ Start time: ____________ Finish time: ____________  Date: ____________ Movements: ____________ Start time: ____________ Finish time: ____________ Date: ____________ Movements: ____________ Start time: ____________ Finish time: ____________ Date: ____________ Movements: ____________ Start time: ____________ Finish time: ____________ Date: ____________ Movements: ____________ Start time: ____________ Finish time:  ____________ Date: ____________ Movements: ____________ Start time: ____________ Finish time: ____________ Date: ____________ Movements: ____________ Start time: ____________ Finish time: ____________ Date: ____________ Movements: ____________ Start time: ____________ Finish time: ____________  Date: ____________ Movements: ____________ Start time: ____________ Finish time: ____________ Date: ____________ Movements: ____________ Start time: ____________ Finish time: ____________ Date: ____________ Movements: ____________ Start time: ____________ Finish time: ____________ Date: ____________ Movements: ____________ Start time: ____________ Finish time: ____________ Date: ____________ Movements: ____________ Start time: ____________ Finish time: ____________ Date: ____________ Movements: ____________ Start time: ____________ Finish time: ____________ Date: ____________ Movements: ____________ Start time: ____________ Finish time: ____________  Date: ____________ Movements: ____________ Start time: ____________ Finish time: ____________ Date: ____________ Movements: ____________ Start time: ____________ Finish time: ____________ Date: ____________ Movements: ____________ Start time: ____________ Finish time: ____________ Date: ____________ Movements: ____________ Start time: ____________ Finish time: ____________ Date: ____________ Movements: ____________ Start time: ____________ Finish time: ____________ Date: ____________ Movements: ____________ Start time: ____________ Finish time: ____________ Date: ____________ Movements: ____________ Start time: ____________ Finish time: ____________  Date: ____________ Movements: ____________ Start time: ____________ Finish time: ____________ Date: ____________ Movements: ____________ Start time: ____________ Finish time: ____________ Date: ____________ Movements: ____________ Start time: ____________ Finish time: ____________ Date: ____________ Movements:  ____________ Start time: ____________ Finish time: ____________ Date: ____________ Movements: ____________ Start time: ____________ Finish time: ____________ Date: ____________ Movements: ____________ Start time: ____________ Finish time: ____________ Date: ____________ Movements: ____________ Start time: ____________ Finish time: ____________  Date: ____________ Movements: ____________ Start time: ____________ Finish time: ____________ Date: ____________ Movements: ____________ Start time: ____________ Finish time: ____________ Date: ____________ Movements: ____________ Start time: ____________ Finish time: ____________ Date: ____________ Movements:   ____________ Start time: ____________ Finish time: ____________ Date: ____________ Movements: ____________ Start time: ____________ Finish time: ____________ Date: ____________ Movements: ____________ Start time: ____________ Finish time: ____________ Date: ____________ Movements: ____________ Start time: ____________ Finish time: ____________  Date: ____________ Movements: ____________ Start time: ____________ Finish time: ____________ Date: ____________ Movements: ____________ Start time: ____________ Finish time: ____________ Date: ____________ Movements: ____________ Start time: ____________ Finish time: ____________ Date: ____________ Movements: ____________ Start time: ____________ Finish time: ____________ Date: ____________ Movements: ____________ Start time: ____________ Finish time: ____________ Date: ____________ Movements: ____________ Start time: ____________ Finish time: ____________ Date: ____________ Movements: ____________ Start time: ____________ Finish time: ____________  Date: ____________ Movements: ____________ Start time: ____________ Finish time: ____________ Date: ____________ Movements: ____________ Start time: ____________ Finish time: ____________ Date: ____________ Movements: ____________ Start time: ____________ Finish  time: ____________ Date: ____________ Movements: ____________ Start time: ____________ Finish time: ____________ Date: ____________ Movements: ____________ Start time: ____________ Finish time: ____________ Date: ____________ Movements: ____________ Start time: ____________ Finish time: ____________ Date: ____________ Movements: ____________ Start time: ____________ Finish time: ____________  Date: ____________ Movements: ____________ Start time: ____________ Finish time: ____________ Date: ____________ Movements: ____________ Start time: ____________ Finish time: ____________ Date: ____________ Movements: ____________ Start time: ____________ Finish time: ____________ Date: ____________ Movements: ____________ Start time: ____________ Finish time: ____________ Date: ____________ Movements: ____________ Start time: ____________ Finish time: ____________ Date: ____________ Movements: ____________ Start time: ____________ Finish time: ____________ Document Released: 09/09/2006 Document Revised: 11/02/2011 Document Reviewed: 03/12/2009 [employer] Patient Information 2013 [employer].  

## 2012-07-25 NOTE — Progress Notes (Signed)
Pt c/o sharp pains in vaginal area yesterday.

## 2012-08-08 ENCOUNTER — Ambulatory Visit (INDEPENDENT_AMBULATORY_CARE_PROVIDER_SITE_OTHER): Payer: Medicaid Other | Admitting: Obstetrics and Gynecology

## 2012-08-08 VITALS — BP 102/50 | Wt 195.0 lb

## 2012-08-08 DIAGNOSIS — Z3490 Encounter for supervision of normal pregnancy, unspecified, unspecified trimester: Secondary | ICD-10-CM

## 2012-08-08 DIAGNOSIS — Z331 Pregnant state, incidental: Secondary | ICD-10-CM

## 2012-08-08 NOTE — Progress Notes (Signed)
[redacted]w[redacted]d

## 2012-08-08 NOTE — Progress Notes (Signed)
[redacted]w[redacted]d No complaints FKCs Labor precautions RTO 2wks

## 2012-08-23 ENCOUNTER — Encounter: Payer: Medicaid Other | Admitting: Obstetrics and Gynecology

## 2012-08-24 ENCOUNTER — Inpatient Hospital Stay (HOSPITAL_COMMUNITY)
Admission: AD | Admit: 2012-08-24 | Discharge: 2012-08-24 | Disposition: A | Discharge status: Home / Self Care | Payer: Medicaid Other | Source: Ambulatory Visit | Attending: Obstetrics and Gynecology | Admitting: Obstetrics and Gynecology

## 2012-08-24 ENCOUNTER — Inpatient Hospital Stay (HOSPITAL_COMMUNITY): Payer: Medicaid Other

## 2012-08-24 ENCOUNTER — Encounter (HOSPITAL_COMMUNITY): Payer: Self-pay | Admitting: *Deleted

## 2012-08-24 DIAGNOSIS — M899 Disorder of bone, unspecified: Secondary | ICD-10-CM

## 2012-08-24 DIAGNOSIS — R109 Unspecified abdominal pain: Secondary | ICD-10-CM | POA: Insufficient documentation

## 2012-08-24 DIAGNOSIS — E282 Polycystic ovarian syndrome: Secondary | ICD-10-CM

## 2012-08-24 DIAGNOSIS — O368390 Maternal care for abnormalities of the fetal heart rate or rhythm, unspecified trimester, not applicable or unspecified: Secondary | ICD-10-CM | POA: Insufficient documentation

## 2012-08-24 DIAGNOSIS — M259 Joint disorder, unspecified: Secondary | ICD-10-CM

## 2012-08-24 DIAGNOSIS — O9989 Other specified diseases and conditions complicating pregnancy, childbirth and the puerperium: Secondary | ICD-10-CM

## 2012-08-24 DIAGNOSIS — O99891 Other specified diseases and conditions complicating pregnancy: Secondary | ICD-10-CM | POA: Insufficient documentation

## 2012-08-24 DIAGNOSIS — O209 Hemorrhage in early pregnancy, unspecified: Secondary | ICD-10-CM

## 2012-08-24 LAB — URINALYSIS, ROUTINE W REFLEX MICROSCOPIC
Glucose, UA: NEGATIVE mg/dL
Nitrite: NEGATIVE
pH: 6 (ref 5.0–8.0)

## 2012-08-24 LAB — URINE MICROSCOPIC-ADD ON

## 2012-08-24 MED ORDER — CYCLOBENZAPRINE HCL 10 MG PO TABS: 10.0000 mg | ORAL_TABLET | Freq: Three times a day (TID) | ORAL | Status: DC | PRN

## 2012-08-24 NOTE — Progress Notes (Signed)
Pt states pain is dull when she is sitting down and sharp when she stands up

## 2012-08-24 NOTE — MAU Note (Signed)
Pt states she is having pain and pressure everywhere, back and lower pelvic pain.

## 2012-08-24 NOTE — MAU Provider Note (Signed)
History   22 yo G2P0010 at 61 6/7 weeks presented unannounced c/o pelvic pressure and back pain.  Describes pain as dull when sitting, sharp when standing.  Denies leaking or bleeding, reports +FM.  Denies dysuria or frequency.  Patient Active Problem List  Diagnosis  . Missed abortion  . First trimester bleeding  . PCOS (polycystic ovarian syndrome)  . Normal pregnancy, repeat  . Flu vaccine given     Chief Complaint  Patient presents with  . Back Pain  . Pelvic Pain     OB History    Grav Para Term Preterm Abortions TAB SAB Ect Mult Living   2    1  1    0      Past Medical History  Diagnosis Date  . No pertinent past medical history   . PCOS (polycystic ovarian syndrome)   . Miscarriage     Past Surgical History  Procedure Date  . Dilation and evacuation 06/18/2011    Procedure: DILATATION AND EVACUATION (D&E);  Surgeon: Hal Morales, MD;  Location: [facility];  Service: Gynecology;  Laterality: N/A;    Family History  Problem Relation Age of Onset  . Arthritis Mother   . Diabetes Maternal Aunt   . Diabetes Maternal Grandmother   . Cancer Maternal Grandmother     BREAST; POST MENOPAUSAL  . Hyperlipidemia Maternal Grandmother   . Hypertension Maternal Grandmother   . Anesthesia problems Neg Hx   . Sickle cell trait Father   . Sickle cell trait Sister   . Diabetes Maternal Uncle   . Kidney disease Maternal Uncle     DIALYSIS  . Alcohol abuse Maternal Uncle   . Drug abuse Maternal Uncle     History  Substance Use Topics  . Smoking status: Never Smoker   . Smokeless tobacco: Never Used  . Alcohol Use: No    Allergies: No Known Allergies  Prescriptions prior to admission  Medication Sig Dispense Refill  . Prenatal Vit-Fe Fumarate-FA (PRENATAL MULTIVITAMIN) TABS Take 1 tablet by mouth daily.      . [DISCONTINUED] nystatin-triamcinolone ointment (MYCOLOG) Apply topically 2 (two) times daily as needed.  30 g  1     Physical Exam   Blood pressure  122/67, pulse 86, temperature 98.4 F (36.9 C), temperature source Oral, resp. rate 16, height 5\' 5"  (1.651 m), weight 196 lb 6 oz (89.075 kg), last menstrual period 12/06/2011, not currently breastfeeding.  Chest clear Heart RRR without murmur Abd gravid, NT Pelvic  Cervix closed, long, posterior, vtx -2. Ext WNL  FHR reactive, but audible arrythmia Very occasional mild contraction--patient unaware.  ED Course  IUP at 35 6/7 weeks 3rd trimester discomforts FHR arrythmia  Plan: Consulted with Dr. Pennie Rushing Check BPP and AFI.--if WNL, will d/c home. Rx Flexeril, with other comfort measures reviewed. Keep scheduled appt on 08/27/12.   Nigel Bridgeman CNM, [place] 08/24/2012 7:43 PM   Addendum: BPP 8/8, AFI 14, vtx. D/C'd home with Flexeril for comfort. Reviewed s/s of labor. Keep scheduled appt at [facility] on 08/26/12 or call prn.  Nigel Bridgeman, CNM 08/24/12 9:15pm

## 2012-08-24 NOTE — L&D Delivery Note (Signed)
Delivery Note  Pt labored down about an hour and began pushing at 0430, doing well, FHR remained reassuring  At 5:25 AM a viable female was delivered via Vaginal, Spontaneous Delivery (Presentation: Right Occiput Anterior). After head delivered, restitution did not occur, posterior arm was then delivered and rest of infant delivered easily,  APGAR: 8, 9; weight 7 lb 8.1 oz (3405 g).   Placenta status: Intact, Spontaneous.  Cord: 3 vessels with the following complications: None.  Cord pH: n/a  Anesthesia: Epidural  Episiotomy: None Lacerations: 1st degree;Perineal Suture Repair: 3.0 vicryl Est. Blood Loss (mL): 300  Mom to postpartum.  Baby to nursery-stable. Infant remains skin-skin, pt plans to BF Pt desires inpatient circumcision   Malissa Hippo 09/14/2012, 7:09 AM

## 2012-08-24 NOTE — MAU Note (Signed)
Pt states having lower abdominal pain and pressure for 2 days. Having a lot of low back pain, denies uti s/s. Notes more vaginal discharge, however no abnormal smell or color, and no bleeding.

## 2012-08-26 ENCOUNTER — Ambulatory Visit (INDEPENDENT_AMBULATORY_CARE_PROVIDER_SITE_OTHER): Payer: Medicaid Other | Admitting: Obstetrics and Gynecology

## 2012-08-26 ENCOUNTER — Encounter: Payer: Self-pay | Admitting: Obstetrics and Gynecology

## 2012-08-26 VITALS — BP 92/58 | Wt 197.0 lb

## 2012-08-26 DIAGNOSIS — Z331 Pregnant state, incidental: Secondary | ICD-10-CM

## 2012-08-26 MED ORDER — TERCONAZOLE 0.4 % VA CREA: 1.0000 | TOPICAL_CREAM | Freq: Every day | VAGINAL | Status: AC

## 2012-08-26 NOTE — Progress Notes (Signed)
[redacted]w[redacted]d GBS, GC/CT today.

## 2012-08-26 NOTE — Progress Notes (Signed)
Pt feeling well. Arhythmia heard with FHT, was evaluated in MAU on 08/24/12 with normal BPP and AFI, no further f/u need per [facility]. GBS and cultures done. Wet prep + yeast, rx terazol x7 days.

## 2012-08-27 LAB — GC/CHLAMYDIA PROBE AMP: GC Probe RNA: NEGATIVE

## 2012-09-05 ENCOUNTER — Ambulatory Visit (INDEPENDENT_AMBULATORY_CARE_PROVIDER_SITE_OTHER): Payer: Medicaid Other | Admitting: Obstetrics and Gynecology

## 2012-09-05 ENCOUNTER — Encounter: Payer: Self-pay | Admitting: Obstetrics and Gynecology

## 2012-09-05 VITALS — BP 90/58 | Wt 200.0 lb

## 2012-09-05 DIAGNOSIS — Z331 Pregnant state, incidental: Secondary | ICD-10-CM

## 2012-09-05 MED ORDER — PRENATAL MULTIVITAMIN CH: 1.0000 | ORAL_TABLET | Freq: Every day | ORAL | Status: DC

## 2012-09-05 NOTE — Progress Notes (Signed)
[redacted]w[redacted]d A/P GBS negative Fetal kick counts reviewed Labor reviewed with pt All patients  questions answered

## 2012-09-05 NOTE — Patient Instructions (Signed)

## 2012-09-05 NOTE — Progress Notes (Signed)
Pt c/o increased pressure, requests cervix check.

## 2012-09-10 ENCOUNTER — Inpatient Hospital Stay (HOSPITAL_COMMUNITY)
Admission: AD | Admit: 2012-09-10 | Discharge: 2012-09-10 | Disposition: A | Discharge status: Home / Self Care | Payer: Medicaid Other | Source: Ambulatory Visit | Attending: Obstetrics and Gynecology | Admitting: Obstetrics and Gynecology

## 2012-09-10 ENCOUNTER — Encounter (HOSPITAL_COMMUNITY): Payer: Self-pay

## 2012-09-10 DIAGNOSIS — O479 False labor, unspecified: Secondary | ICD-10-CM

## 2012-09-10 NOTE — MAU Note (Signed)
Pt states that contractions began about 10pm and that she feels as if the baby is not moving as much. Denies any pain or leaking of fluid.

## 2012-09-10 NOTE — Progress Notes (Signed)
Pt states that contractions began about 10pm and that she feels as if the baby is not moving as much. Denies any pain or leaking of fluid.     No vag bleeding, did not take any med for back pain to lower back comes and goes Comfortable, some pressure O BP 112/70  Pulse 87  Temp 97.8 F (36.6 C)  Resp 18  Ht 5\' 5"  (1.651 m)  Wt 204 lb 6.4 oz (92.715 kg)  BMI 34.01 kg/m2  SpO2 100%  LMP 12/06/2011      fhts category 1      abd soft between uc q 2-5 mild      Neg CVAT      Vag closed 70 high vtx per leopolds A [redacted]w[redacted]d    Low backache P has rx flexeral has not picked it up, exercise, ice, heat, position changes discussed. Reviewed s/s labor uc q 5 minutes or closer and becoming more intense over time, srom, vag bleeding, bloody mucus is normal, bright red like you cut your finger is an emergency, daily fetal kick 10 x in 2 hours counts if less report, comfort measures discussed. Encouraged 8 water daily and frequent voids. Lavera Guise, CNM

## 2012-09-12 ENCOUNTER — Ambulatory Visit: Payer: Medicaid Other | Admitting: Obstetrics and Gynecology

## 2012-09-12 ENCOUNTER — Encounter (HOSPITAL_COMMUNITY): Payer: Self-pay | Admitting: *Deleted

## 2012-09-12 ENCOUNTER — Inpatient Hospital Stay (HOSPITAL_COMMUNITY)
Admission: AD | Admit: 2012-09-12 | Discharge: 2012-09-13 | Disposition: A | Discharge status: Home / Self Care | Payer: Medicaid Other | Source: Ambulatory Visit | Attending: Obstetrics and Gynecology | Admitting: Obstetrics and Gynecology

## 2012-09-12 VITALS — BP 118/74 | Wt 204.0 lb

## 2012-09-12 DIAGNOSIS — O368390 Maternal care for abnormalities of the fetal heart rate or rhythm, unspecified trimester, not applicable or unspecified: Secondary | ICD-10-CM | POA: Insufficient documentation

## 2012-09-12 DIAGNOSIS — O479 False labor, unspecified: Secondary | ICD-10-CM | POA: Insufficient documentation

## 2012-09-12 DIAGNOSIS — Z3490 Encounter for supervision of normal pregnancy, unspecified, unspecified trimester: Secondary | ICD-10-CM

## 2012-09-12 NOTE — MAU Note (Signed)
"  Hopefully labor" Cramps what might be contractions that last 10-15 secs every 10-15 minutes. No bleeding or leaking of fluid. Good fetal movement

## 2012-09-12 NOTE — Progress Notes (Signed)
Doing well, but ready. Cervix FT, 70%, vtx, -2, posterior. Labor s/s reviewed.

## 2012-09-12 NOTE — Progress Notes (Signed)
Pt stated no issues today. Pt wants a cervix check today.  

## 2012-09-13 ENCOUNTER — Other Ambulatory Visit (HOSPITAL_COMMUNITY): Payer: Self-pay | Admitting: Obstetrics and Gynecology

## 2012-09-13 ENCOUNTER — Inpatient Hospital Stay (HOSPITAL_COMMUNITY)
Admission: AD | Admit: 2012-09-13 | Discharge: 2012-09-15 | DRG: 775 | Disposition: A | Discharge status: Home / Self Care | Payer: Medicaid Other | Source: Ambulatory Visit | Attending: Obstetrics and Gynecology | Admitting: Obstetrics and Gynecology

## 2012-09-13 ENCOUNTER — Inpatient Hospital Stay (HOSPITAL_COMMUNITY): Payer: Medicaid Other

## 2012-09-13 ENCOUNTER — Observation Stay (HOSPITAL_COMMUNITY): Payer: Medicaid Other

## 2012-09-13 ENCOUNTER — Observation Stay (HOSPITAL_COMMUNITY)
Admission: AD | Admit: 2012-09-13 | Discharge: 2012-09-13 | Disposition: A | Discharge status: Home / Self Care | Payer: Medicaid Other | Source: Ambulatory Visit | Attending: Obstetrics and Gynecology | Admitting: Obstetrics and Gynecology

## 2012-09-13 ENCOUNTER — Encounter (HOSPITAL_COMMUNITY): Payer: Self-pay | Admitting: *Deleted

## 2012-09-13 ENCOUNTER — Encounter (HOSPITAL_COMMUNITY): Payer: Self-pay | Admitting: Obstetrics and Gynecology

## 2012-09-13 DIAGNOSIS — O358XX0 Maternal care for other (suspected) fetal abnormality and damage, not applicable or unspecified: Secondary | ICD-10-CM

## 2012-09-13 DIAGNOSIS — O209 Hemorrhage in early pregnancy, unspecified: Secondary | ICD-10-CM

## 2012-09-13 DIAGNOSIS — E282 Polycystic ovarian syndrome: Secondary | ICD-10-CM

## 2012-09-13 DIAGNOSIS — IMO0002 Reserved for concepts with insufficient information to code with codable children: Secondary | ICD-10-CM | POA: Diagnosis present

## 2012-09-13 DIAGNOSIS — D649 Anemia, unspecified: Secondary | ICD-10-CM | POA: Diagnosis not present

## 2012-09-13 DIAGNOSIS — O368390 Maternal care for abnormalities of the fetal heart rate or rhythm, unspecified trimester, not applicable or unspecified: Principal | ICD-10-CM | POA: Insufficient documentation

## 2012-09-13 DIAGNOSIS — O4700 False labor before 37 completed weeks of gestation, unspecified trimester: Secondary | ICD-10-CM | POA: Insufficient documentation

## 2012-09-13 DIAGNOSIS — O9903 Anemia complicating the puerperium: Secondary | ICD-10-CM | POA: Diagnosis not present

## 2012-09-13 LAB — CBC
HCT: 37.5 % (ref 36.0–46.0)
Hemoglobin: 12 g/dL (ref 12.0–15.0)
MCH: 31.3 pg (ref 26.0–34.0)
MCHC: 33.5 g/dL (ref 30.0–36.0)
MCHC: 34.9 g/dL (ref 30.0–36.0)
MCV: 93.2 fL (ref 78.0–100.0)
MCV: 91 fL (ref 78.0–100.0)
RBC: 3.84 MIL/uL — ABNORMAL LOW (ref 3.87–5.11)
RDW: 12.8 % (ref 11.5–15.5)

## 2012-09-13 MED ORDER — EPHEDRINE 5 MG/ML INJ
10.0000 mg | INTRAVENOUS | Status: DC | PRN
  Filled 2012-09-13: qty 4

## 2012-09-13 MED ORDER — LACTATED RINGERS IV SOLN: INTRAVENOUS | Status: DC

## 2012-09-13 MED ORDER — DIPHENHYDRAMINE HCL 50 MG/ML IJ SOLN: 12.5000 mg | INTRAMUSCULAR | Status: DC | PRN

## 2012-09-13 MED ORDER — LACTATED RINGERS IV SOLN
INTRAVENOUS | Status: DC
  Administered 2012-09-13: 09:00:00 via INTRAVENOUS

## 2012-09-13 MED ORDER — ACETAMINOPHEN 500 MG PO TABS
1000.0000 mg | ORAL_TABLET | Freq: Once | ORAL | Status: AC
  Administered 2012-09-13: 1000 mg via ORAL
  Filled 2012-09-13: qty 2

## 2012-09-13 MED ORDER — FENTANYL CITRATE 0.05 MG/ML IJ SOLN
100.0000 ug | INTRAMUSCULAR | Status: DC | PRN
  Administered 2012-09-13 – 2012-09-14 (×2): 100 ug via INTRAVENOUS
  Filled 2012-09-13 (×2): qty 2

## 2012-09-13 MED ORDER — OXYCODONE-ACETAMINOPHEN 5-325 MG PO TABS: 1.0000 | ORAL_TABLET | ORAL | Status: DC | PRN

## 2012-09-13 MED ORDER — PROMETHAZINE HCL 25 MG/ML IJ SOLN
25.0000 mg | Freq: Once | INTRAMUSCULAR | Status: DC
  Filled 2012-09-13: qty 1

## 2012-09-13 MED ORDER — LACTATED RINGERS IV BOLUS (SEPSIS)
1000.0000 mL | Freq: Once | INTRAVENOUS | Status: AC
  Administered 2012-09-13: 1000 mL via INTRAVENOUS

## 2012-09-13 MED ORDER — EPHEDRINE 5 MG/ML INJ: 10.0000 mg | INTRAVENOUS | Status: DC | PRN

## 2012-09-13 MED ORDER — PHENYLEPHRINE 40 MCG/ML (10ML) SYRINGE FOR IV PUSH (FOR BLOOD PRESSURE SUPPORT)
80.0000 ug | PREFILLED_SYRINGE | INTRAVENOUS | Status: DC | PRN
  Filled 2012-09-13: qty 5

## 2012-09-13 MED ORDER — LIDOCAINE HCL (PF) 1 % IJ SOLN
30.0000 mL | INTRAMUSCULAR | Status: DC | PRN
  Administered 2012-09-14: 30 mL via SUBCUTANEOUS
  Filled 2012-09-13: qty 30

## 2012-09-13 MED ORDER — FENTANYL 2.5 MCG/ML BUPIVACAINE 1/10 % EPIDURAL INFUSION (WH - ANES)
14.0000 mL/h | INTRAMUSCULAR | Status: DC
  Administered 2012-09-14: 14 mL/h via EPIDURAL
  Filled 2012-09-13: qty 125

## 2012-09-13 MED ORDER — OXYTOCIN BOLUS FROM INFUSION
500.0000 mL | INTRAVENOUS | Status: DC
  Administered 2012-09-14: 500 mL via INTRAVENOUS

## 2012-09-13 MED ORDER — FENTANYL CITRATE 0.05 MG/ML IJ SOLN
100.0000 ug | INTRAMUSCULAR | Status: DC | PRN
  Administered 2012-09-13: 100 ug via INTRAVENOUS
  Filled 2012-09-13: qty 2

## 2012-09-13 MED ORDER — PHENYLEPHRINE 40 MCG/ML (10ML) SYRINGE FOR IV PUSH (FOR BLOOD PRESSURE SUPPORT): 80.0000 ug | PREFILLED_SYRINGE | INTRAVENOUS | Status: DC | PRN

## 2012-09-13 MED ORDER — IBUPROFEN 600 MG PO TABS
600.0000 mg | ORAL_TABLET | Freq: Four times a day (QID) | ORAL | Status: DC | PRN
  Administered 2012-09-14: 600 mg via ORAL
  Filled 2012-09-13: qty 1

## 2012-09-13 MED ORDER — LACTATED RINGERS IV SOLN
500.0000 mL | Freq: Once | INTRAVENOUS | Status: AC
  Administered 2012-09-14: 500 mL via INTRAVENOUS

## 2012-09-13 MED ORDER — LACTATED RINGERS IV SOLN
500.0000 mL | INTRAVENOUS | Status: DC | PRN
  Administered 2012-09-13: 1000 mL via INTRAVENOUS

## 2012-09-13 MED ORDER — ACETAMINOPHEN 500 MG PO TABS: 1000.0000 mg | ORAL_TABLET | ORAL | Status: DC | PRN

## 2012-09-13 MED ORDER — NALBUPHINE SYRINGE 5 MG/0.5 ML
10.0000 mg | INJECTION | Freq: Once | INTRAMUSCULAR | Status: AC
  Administered 2012-09-13: 10 mg via INTRAMUSCULAR
  Filled 2012-09-13: qty 1

## 2012-09-13 MED ORDER — ONDANSETRON HCL 4 MG/2ML IJ SOLN: 4.0000 mg | Freq: Four times a day (QID) | INTRAMUSCULAR | Status: DC | PRN

## 2012-09-13 MED ORDER — CITRIC ACID-SODIUM CITRATE 334-500 MG/5ML PO SOLN: 30.0000 mL | ORAL | Status: DC | PRN

## 2012-09-13 MED ORDER — OXYTOCIN 40 UNITS IN LACTATED RINGERS INFUSION - SIMPLE MED
62.5000 mL/h | INTRAVENOUS | Status: DC
  Administered 2012-09-14 (×2): 62.5 mL/h via INTRAVENOUS
  Filled 2012-09-13: qty 1000

## 2012-09-13 NOTE — H&P (Signed)
Robin [name] is a 22 y.o. female presenting for labor eval, pt was seen earlier in the evening for c/o headache and ctx, there was no cervical change and headache resolved after tylenol. FHR showed arrythmia and initially was non-reactive, however did become reactive before discharge. She was given nubain 10mg  IM and dc'd home.   She returned about 0345 w c/o stronger and more persistent ctx and the nubain had worn off.  Cervix remained unchanged at 1cm. There was note of irregular FHR tracing that appeared to be non-reactive, so BPP was obtained, with a 6/8 score -2 for breathing, also reported is suspicion of fetal cardiac abnormality, unable to view great vessels and minimal flow through foramen ovale.   HPI: Pt began [facility] at [facility] at 12wks, pt was seen at about 6wks for 1st trim bleeding, [facility] was noted, bleeding resolved Anatomy US at 18wks was normal, however limited cardiac views, f/u US at 22wks was normal  1hr gtt at 26wks =63  Pt was seen in MAU at 35wks, arrythmia heard, and BPP and AFI were normal GBS cx neg   Maternal Medical History:  Reason for admission: Reason for admission: contractions.  Fetal arrythmia  Contractions: Onset was 6-12 hours ago.   Frequency: irregular.   Duration is approximately 40 seconds.   Perceived severity is moderate.    Fetal activity: Perceived fetal activity is normal.    Prenatal complications: no prenatal complications   OB History    Grav Para Term Preterm Abortions TAB SAB Ect Mult Living   2    1  1    0     Past Medical History  Diagnosis Date  . No pertinent past medical history   . PCOS (polycystic ovarian syndrome)   . Miscarriage    Past Surgical History  Procedure Date  . Dilation and evacuation 06/18/2011    Procedure: DILATATION AND EVACUATION (D&E);  Surgeon: Hal Morales, MD;  Location: [facility];  Service: Gynecology;  Laterality: N/A;   Family History: family history includes Alcohol abuse in her maternal uncle;  Arthritis in her mother; Cancer in her maternal grandmother; Diabetes in her maternal aunt, maternal grandmother, and maternal uncle; Drug abuse in her maternal uncle; Hyperlipidemia in her maternal grandmother; Hypertension in her maternal grandmother; Kidney disease in her maternal uncle; and Sickle cell trait in her father and sister.  There is no history of Anesthesia problems. Social History:  reports that she has never smoked. She has never used smokeless tobacco. She reports that she does not drink alcohol or use illicit drugs.   Prenatal Transfer Tool  Maternal Diabetes: No Genetic Screening: Declined Maternal Ultrasounds/Referrals: Abnormal:  Findings:   Other: arrythmia, ?cardiac anomaly noted on [place] today  Fetal Ultrasounds or other Referrals:  None Maternal Substance Abuse:  No Significant Maternal Medications:  None Significant Maternal Lab Results:  Lab values include: Group B Strep negative Other Comments:  None MFM consult today   Review of Systems  All other systems reviewed and are negative.    Dilation: 1 Effacement (%): 60 Station: -1 Exam by:: Sanda Klein CNM Blood pressure 130/80, pulse 86, temperature 97.4 F (36.3 C), temperature source Oral, resp. rate 18, height 5\' 5"  (1.651 m), weight 204 lb (92.534 kg), last menstrual period 12/06/2011. Maternal Exam:  Uterine Assessment: Contraction strength is mild.  Contraction duration is 40 seconds. Contraction frequency is irregular.   Abdomen: Patient reports no abdominal tenderness. Fundal height is aga.   Estimated fetal weight is  7-8#.   Fetal presentation: vertex  Introitus: Normal vulva. Normal vagina.  Pelvis: adequate for delivery.   Cervix: Cervix evaluated by digital exam.     Fetal Exam Fetal Monitor Review: Mode: ultrasound.   Baseline rate: 130.  Variability: moderate (6-25 bpm).   Pattern: no accelerations and no decelerations.    Fetal State Assessment: Category II - tracings are  indeterminate.     Physical Exam  Nursing note and vitals reviewed. Constitutional: She is oriented to person, place, and time. She appears well-developed and well-nourished.       Breathing through some ctx  HENT:  Head: Normocephalic.  Eyes: Pupils are equal, round, and reactive to light.  Neck: Normal range of motion.  Cardiovascular: Normal rate, regular rhythm and normal heart sounds.   Respiratory: Effort normal and breath sounds normal.  GI: Soft. Bowel sounds are normal.  Genitourinary: Vagina normal.       Cervix =1cm/50/-1   Musculoskeletal: Normal range of motion.  Neurological: She is alert and oriented to person, place, and time. She has normal reflexes.  Skin: Skin is warm and dry.  Psychiatric: She has a normal mood and affect. Her behavior is normal.    Prenatal labs: ABO, Rh: O/POS/-- (07/18 1044) Antibody: NEG (07/18 1044) Rubella: 56.4 (07/18 1044) RPR: NON REAC (10/24 1122)  HBsAg: NEGATIVE (07/18 1044)  HIV: NON REACTIVE (07/18 1044)  GBS: NEGATIVE (01/03 1059)   Assessment/Plan: IUP at [redacted]w[redacted]d Fetal arrythmia non-reactive FHR GBS neg Prodromal labor   Admit to antenatal for observation CEFM IVF LR bolus, then 125/hour Fentanyl q1h prn  MFM consult and [place]  D/w Dr Hansel Starling M 09/13/2012, 7:00 AM

## 2012-09-13 NOTE — Discharge Instructions (Signed)
Fetal Movement Counts Patient Name: __________________________________________________ Patient Due Date: ____________________ Robin [name] counts is highly recommended in high risk pregnancies, but it is a good idea for every pregnant woman to do. Start counting fetal movements at 28 weeks of the pregnancy. Fetal movements increase after eating a full meal or eating or drinking something sweet (the blood sugar is higher). It is also important to drink plenty of fluids (well hydrated) before doing the count. Lie on your left side because it helps with the circulation or you can sit in a comfortable chair with your arms over your belly (abdomen) with no distractions around you. DOING THE COUNT  Try to do the count the same time of day each time you do it.  Mark the day and time, then see how long it takes for you to feel 10 movements (kicks, flutters, swishes, rolls). You should have at least 10 movements within 2 hours. You will most likely feel 10 movements in much less than 2 hours. If you do not, wait an hour and count again. After a couple of days you will see a pattern.  What you are looking for is a change in the pattern or not enough counts in 2 hours. Is it taking longer in time to reach 10 movements? SEEK MEDICAL CARE IF:  You feel less than 10 counts in 2 hours. Tried twice.  No movement in one hour.  The pattern is changing or taking longer each day to reach 10 counts in 2 hours.  You feel the baby is not moving as it usually does. Date: ____________ Movements: ____________ Start time: ____________ Doreatha Martin time: ____________  Date: ____________ Movements: ____________ Start time: ____________ Doreatha Martin time: ____________ Date: ____________ Movements: ____________ Start time: ____________ Doreatha Martin time: ____________ Date: ____________ Movements: ____________ Start time: ____________ Doreatha Martin time: ____________ Date: ____________ Movements: ____________ Start time: ____________ Doreatha Martin time:  ____________ Date: ____________ Movements: ____________ Start time: ____________ Doreatha Martin time: ____________ Date: ____________ Movements: ____________ Start time: ____________ Doreatha Martin time: ____________ Date: ____________ Movements: ____________ Start time: ____________ Doreatha Martin time: ____________  Date: ____________ Movements: ____________ Start time: ____________ Doreatha Martin time: ____________ Date: ____________ Movements: ____________ Start time: ____________ Doreatha Martin time: ____________ Date: ____________ Movements: ____________ Start time: ____________ Doreatha Martin time: ____________ Date: ____________ Movements: ____________ Start time: ____________ Doreatha Martin time: ____________ Date: ____________ Movements: ____________ Start time: ____________ Doreatha Martin time: ____________ Date: ____________ Movements: ____________ Start time: ____________ Doreatha Martin time: ____________ Date: ____________ Movements: ____________ Start time: ____________ Doreatha Martin time: ____________  Date: ____________ Movements: ____________ Start time: ____________ Doreatha Martin time: ____________ Date: ____________ Movements: ____________ Start time: ____________ Doreatha Martin time: ____________ Date: ____________ Movements: ____________ Start time: ____________ Doreatha Martin time: ____________ Date: ____________ Movements: ____________ Start time: ____________ Doreatha Martin time: ____________ Date: ____________ Movements: ____________ Start time: ____________ Doreatha Martin time: ____________ Date: ____________ Movements: ____________ Start time: ____________ Doreatha Martin time: ____________ Date: ____________ Movements: ____________ Start time: ____________ Doreatha Martin time: ____________  Date: ____________ Movements: ____________ Start time: ____________ Doreatha Martin time: ____________ Date: ____________ Movements: ____________ Start time: ____________ Doreatha Martin time: ____________ Date: ____________ Movements: ____________ Start time: ____________ Doreatha Martin time: ____________ Date: ____________ Movements:  ____________ Start time: ____________ Doreatha Martin time: ____________ Date: ____________ Movements: ____________ Start time: ____________ Doreatha Martin time: ____________ Date: ____________ Movements: ____________ Start time: ____________ Doreatha Martin time: ____________ Date: ____________ Movements: ____________ Start time: ____________ Doreatha Martin time: ____________  Date: ____________ Movements: ____________ Start time: ____________ Doreatha Martin time: ____________ Date: ____________ Movements: ____________ Start time: ____________ Doreatha Martin time: ____________ Date: ____________ Movements: ____________ Start time: ____________ Doreatha Martin time: ____________ Date: ____________ Movements:  ____________ Start time: ____________ Doreatha Martin time: ____________ Date: ____________ Movements: ____________ Start time: ____________ Doreatha Martin time: ____________ Date: ____________ Movements: ____________ Start time: ____________ Doreatha Martin time: ____________ Date: ____________ Movements: ____________ Start time: ____________ Doreatha Martin time: ____________  Date: ____________ Movements: ____________ Start time: ____________ Doreatha Martin time: ____________ Date: ____________ Movements: ____________ Start time: ____________ Doreatha Martin time: ____________ Date: ____________ Movements: ____________ Start time: ____________ Doreatha Martin time: ____________ Date: ____________ Movements: ____________ Start time: ____________ Doreatha Martin time: ____________ Date: ____________ Movements: ____________ Start time: ____________ Doreatha Martin time: ____________ Date: ____________ Movements: ____________ Start time: ____________ Doreatha Martin time: ____________ Date: ____________ Movements: ____________ Start time: ____________ Doreatha Martin time: ____________  Date: ____________ Movements: ____________ Start time: ____________ Doreatha Martin time: ____________ Date: ____________ Movements: ____________ Start time: ____________ Doreatha Martin time: ____________ Date: ____________ Movements: ____________ Start time: ____________ Doreatha Martin  time: ____________ Date: ____________ Movements: ____________ Start time: ____________ Doreatha Martin time: ____________ Date: ____________ Movements: ____________ Start time: ____________ Doreatha Martin time: ____________ Date: ____________ Movements: ____________ Start time: ____________ Doreatha Martin time: ____________ Date: ____________ Movements: ____________ Start time: ____________ Doreatha Martin time: ____________  Date: ____________ Movements: ____________ Start time: ____________ Doreatha Martin time: ____________ Date: ____________ Movements: ____________ Start time: ____________ Doreatha Martin time: ____________ Date: ____________ Movements: ____________ Start time: ____________ Doreatha Martin time: ____________ Date: ____________ Movements: ____________ Start time: ____________ Doreatha Martin time: ____________ Date: ____________ Movements: ____________ Start time: ____________ Doreatha Martin time: ____________ Date: ____________ Movements: ____________ Start time: ____________ Doreatha Martin time: ____________ Document Released: 09/09/2006 Document Revised: 11/02/2011 Document Reviewed: 03/12/2009 [employer] Patient Information 2013 [employer] Braxton Hicks Contractions Pregnancy is commonly associated with contractions of the uterus throughout the pregnancy. Towards the end of pregnancy (32 to 34 weeks), these contractions [facility] Willa Rough) can develop more often and may become more forceful. This is not true labor because these contractions do not result in opening (dilatation) and thinning of the cervix. They are sometimes difficult to tell apart from true labor because these contractions can be forceful and people have different pain tolerances. You should not feel embarrassed if you go to the hospital with false labor. Sometimes, the only way to tell if you are in true labor is for your caregiver to follow the changes in the cervix. How to tell the difference between true and false labor:  False labor.  The contractions of false labor are usually shorter,  irregular and not as hard as those of true labor.  They are often felt in the front of the lower abdomen and in the groin.  They may leave with walking around or changing positions while lying down.  They get weaker and are shorter lasting as time goes on.  These contractions are usually irregular.  They do not usually become progressively stronger, regular and closer together as with true labor.  True labor.  Contractions in true labor last 30 to 70 seconds, become very regular, usually become more intense, and increase in frequency.  They do not go away with walking.  The discomfort is usually felt in the top of the uterus and spreads to the lower abdomen and low back.  True labor can be determined by your caregiver with an exam. This will show that the cervix is dilating and getting thinner. If there are no prenatal problems or other health problems associated with the pregnancy, it is completely safe to be sent home with false labor and await the onset of true labor. HOME CARE INSTRUCTIONS   Keep up with your usual exercises and instructions.  Take medications as directed.  Keep your regular prenatal appointment.  Eat and drink lightly if you think you are going into labor.  If BH contractions are making you uncomfortable:  Change your activity position from lying down or resting to walking/walking to resting.  Sit and rest in a tub of warm water.  Drink 2 to 3 glasses of water. Dehydration may cause B-H contractions.  Do slow and deep breathing several times an hour. SEEK IMMEDIATE MEDICAL CARE IF:   Your contractions continue to become stronger, more regular, and closer together.  You have a gushing, burst or leaking of fluid from the vagina.  An oral temperature above 102 F (38.9 C) develops.  You have passage of blood-tinged mucus.  You develop vaginal bleeding.  You develop continuous belly (abdominal) pain.  You have low back pain that you never had  before.  You feel the baby's head pushing down causing pelvic pressure.  The baby is not moving as much as it used to. Document Released: 08/10/2005 Document Revised: 11/02/2011 Document Reviewed: 02/01/2009 [facility] Patient Information 2013 [place], [place].

## 2012-09-13 NOTE — MAU Note (Signed)
contractions 

## 2012-09-13 NOTE — MAU Note (Signed)
Patient to ANTE room 157 via w/c.

## 2012-09-13 NOTE — H&P (Signed)
Robin [name] is a 22 y.o. female presenting for labor eval, ctx have persisted throughout the day after pt went home this morning. They are now stronger and closer together, She denies any VB, LOF reports +FM.   Pt was seen yesterday for c/o headache and ctx, there was no cervical change and headache resolved after tylenol. FHR showed arrythmia and initially was non-reactive, however did become reactive before discharge. She was given nubain 10mg  IM and dc'd home.   She returned about 0345 w c/o stronger and more persistent ctx and the nubain had worn off. Cervix remained unchanged at 1cm. There was note of irregular FHR tracing that appeared to be non-reactive, so BPP was obtained, with a 6/8 score -2 for breathing, also reported is suspicion of fetal cardiac abnormality, unable to view great vessels and minimal flow through foramen ovale.  She was admitted for observation and seen by MFM, who then was going to d/w pediatric cardiology. Pt was dc'd home late this morning.    HPI: Pt began [facility] at [facility] at 12wks, pt was seen at about 6wks for 1st trim bleeding, [facility] was noted, bleeding resolved  Anatomy US at 18wks was normal, however limited cardiac views, f/u US at 22wks was normal  1hr gtt at 26wks =63  Pt was seen in MAU at 35wks, arrythmia heard, and BPP and AFI were normal  GBS cx neg    Maternal Medical History:  Reason for admission: Reason for admission: contractions.  Fetal arrythmia  Contractions: Onset was 6-12 hours ago.  Frequency: irregular.  Duration is approximately 40 seconds.  Perceived severity is moderate.  Fetal activity: Perceived fetal activity is normal.  Prenatal complications: no prenatal complications OB History    Grav  Para  Term  Preterm  Abortions  TAB  SAB  Ect  Mult  Living    2     1   1     0      Past Medical History   Diagnosis  Date   .  No pertinent past medical history    .  PCOS (polycystic ovarian syndrome)    .  Miscarriage     Past  Surgical History   Procedure  Date   .  Dilation and evacuation  06/18/2011     Procedure: DILATATION AND EVACUATION (D&E); Surgeon: Hal Morales, MD; Location: [facility]; Service: Gynecology; Laterality: N/A;   Family History: family history includes Alcohol abuse in her maternal uncle; Arthritis in her mother; Cancer in her maternal grandmother; Diabetes in her maternal aunt, maternal grandmother, and maternal uncle; Drug abuse in her maternal uncle; Hyperlipidemia in her maternal grandmother; Hypertension in her maternal grandmother; Kidney disease in her maternal uncle; and Sickle cell trait in her father and sister. There is no history of Anesthesia problems.  Social History: reports that she has never smoked. She has never used smokeless tobacco. She reports that she does not drink alcohol or use illicit drugs.  Prenatal Transfer Tool   Maternal Diabetes: No  Genetic Screening: Declined  Maternal Ultrasounds/Referrals: Abnormal: Findings: Other: arrythmia, minimal flow through foramen ovale  Fetal Ultrasounds or other Referrals: MFM consult 09/13/12  Maternal Substance Abuse: No  Significant Maternal Medications: None  Significant Maternal Lab Results: Lab values include: Group B Strep negative  Other Comments: none    Review of Systems  All other systems reviewed and are negative.  Dilation: 3cm  BBOW  Effacement (%): 100 Station: -1  Exam by:: Sanda Klein CNM  Blood pressure 136/84, pulse 103, temperature 98.7 F (36.3 C), temperature source Oral, resp. rate 18, height 5\' 5"  (1.651 m), weight 204 lb (92.534 kg), last menstrual period 12/06/2011.   Maternal Exam:  Uterine Assessment: Contraction strength is mod. Contraction duration is 40 seconds. Contraction frequency is irregular.  Abdomen: Patient reports no abdominal tenderness. Fundal height is aga.  Estimated fetal weight is 7-8#.  Fetal presentation: vertex  Introitus: Normal vulva. Normal vagina. Pelvis: adequate  for delivery.  Cervix: Cervix evaluated by digital exam.  Fetal Exam  Fetal Monitor Review: Mode: ultrasound.  Baseline rate: 140 Variability: moderate (6-25 bpm).  Pattern: accelerations and no decelerations.  Fetal State Assessment: Category I   Physical Exam  Nursing note and vitals reviewed.  Constitutional: She is oriented to person, place, and time. She appears well-developed and well-nourished.  Breathing through  ctx  HENT:  Head: Normocephalic.  Eyes: Pupils are equal, round, and reactive to light.  Neck: Normal range of motion.  Cardiovascular: Normal rate, regular rhythm and normal heart sounds.  Respiratory: Effort normal and breath sounds normal.  GI: Soft. Bowel sounds are normal.  Genitourinary: Vagina normal.  Cervix =3cm/100/-1 BBOW  Musculoskeletal: Normal range of motion.  Neurological: She is alert and oriented to person, place, and time. She has normal reflexes.  Skin: Skin is warm and dry.  Psychiatric: She has a normal mood and affect. Her behavior is normal.  Prenatal labs:  ABO, Rh: O/POS/-- (07/18 1044)  Antibody: NEG (07/18 1044)  Rubella: 56.4 (07/18 1044)  RPR: NON REAC (10/24 1122)  HBsAg: NEGATIVE (07/18 1044)  HIV: NON REACTIVE (07/18 1044)  GBS: NEGATIVE (01/03 1059)  Assessment/Plan:  IUP at [redacted]w[redacted]d  Fetal arrythmia  GBS neg  Early labor   Admit to b.s per c/w Dr Normand Sloop Routine L&D orders Fentanyl IVP q1h Epidural PRN   Brinson Tozzi M  09/13/2012, 1015PM

## 2012-09-13 NOTE — MAU Note (Signed)
Patient presents with contractions "one after the other" with increase in intensity.

## 2012-09-13 NOTE — Discharge Summary (Signed)
Obstetric Discharge Summary Reason for Admission: observation/evaluation and observation for fetal cardiac arrythmia Prenatal Procedures: NST and ultrasound Intrapartum Procedures: n/a Postpartum Procedures: none Complications-Operative and Postpartum: none Hemoglobin  Date Value Range Status  09/13/2012 12.0  12.0 - 15.0 g/dL Final     HCT  Date Value Range Status  09/13/2012 35.8* 36.0 - 46.0 % Final    Physical Exam:  General: alert and cooperative Lochia: none Uterine Fundus: gravid soft NT Incision: none DVT Evaluation: No evidence of DVT seen on physical exam.  Discharge Diagnoses: and hospital course:  pt came to MAU for r/o labor.  she recieved IV pain meds.  She was not in labor, however a cardiac arrythmia was noted and a ? fetal cardiac anomaly was noted on [place]. pt had a repeat US today by Dr Sherrie George.  Dr Sherrie George felt that she may have some narrowing of the foramen ovale but the heart appears normal..  Her BPP 8/8.  Her NST is category 1.  She needs normal follow up in the office.  The infant has occ PACs and may need an echo.  Dr Sherrie George is going to check with pediartric cardiology.    Discharge Information: pt to home Date: 09/13/2012 Activity: unrestricted Diet: routine Medications: None Condition: stable Instructions: pt given labor precautions Discharge to: home Follow-up Information    Follow up with Malissa Hippo, CNM. On 09/19/2012. (at 11:45)    Contact information:   [address] [place] [address] [place] [address] [phone]          Newborn Data: This patient has no babies on file. Home with mother. the pt is still pregnant.  Jaymian Bogart A 09/13/2012, 12:34 PM

## 2012-09-13 NOTE — MAU Provider Note (Signed)
History     CSN: [id number]  Arrival date and time: 09/12/12 2327   First Provider Initiated Contact with Patient 09/13/12 0001      Chief Complaint  Patient presents with  . Contractions   HPI Comments: Pt is a G2P0 at [redacted]w[redacted]d arrives unannounced w CC of HA x2 hours and ctx all day, wondering if in labor, denies any VB, LOF, GFM. Has not tried any meds for HA, last ate around 8-9pm, reports drinking at least 64oz of water.      Past Medical History  Diagnosis Date  . No pertinent past medical history   . PCOS (polycystic ovarian syndrome)   . Miscarriage     Past Surgical History  Procedure Date  . Dilation and evacuation 06/18/2011    Procedure: DILATATION AND EVACUATION (D&E);  Surgeon: Hal Morales, MD;  Location: [facility];  Service: Gynecology;  Laterality: N/A;    Family History  Problem Relation Age of Onset  . Arthritis Mother   . Diabetes Maternal Aunt   . Diabetes Maternal Grandmother   . Cancer Maternal Grandmother     BREAST; POST MENOPAUSAL  . Hyperlipidemia Maternal Grandmother   . Hypertension Maternal Grandmother   . Anesthesia problems Neg Hx   . Sickle cell trait Father   . Sickle cell trait Sister   . Diabetes Maternal Uncle   . Kidney disease Maternal Uncle     DIALYSIS  . Alcohol abuse Maternal Uncle   . Drug abuse Maternal Uncle     History  Substance Use Topics  . Smoking status: Never Smoker   . Smokeless tobacco: Never Used  . Alcohol Use: No    Allergies: No Known Allergies  Prescriptions prior to admission  Medication Sig Dispense Refill  . cyclobenzaprine (FLEXERIL) 10 MG tablet Take 1 tablet (10 mg total) by mouth 3 (three) times daily as needed for muscle spasms.  30 tablet  0  . Prenatal Vit-Fe Fumarate-FA (PRENATAL MULTIVITAMIN) TABS Take 1 tablet by mouth daily.  90 tablet  3    Review of Systems  Neurological: Positive for headaches.  All other systems reviewed and are negative.   Physical Exam   Blood  pressure 133/81, pulse 84, temperature 99.3 F (37.4 C), temperature source Oral, resp. rate 18, last menstrual period 12/06/2011.  Physical Exam  Nursing note and vitals reviewed. Constitutional: She is oriented to person, place, and time. She appears well-developed and well-nourished.  HENT:  Head: Normocephalic.  Eyes: Pupils are equal, round, and reactive to light.  Neck: Normal range of motion.  Cardiovascular: Normal rate, regular rhythm and normal heart sounds.   Respiratory: Effort normal and breath sounds normal.  GI: Soft. Bowel sounds are normal.  Genitourinary: Vagina normal.       VE =FT/th/-1, posterior, vtx well applied   Musculoskeletal: Normal range of motion.  Neurological: She is alert and oriented to person, place, and time. She has normal reflexes.  Skin: Skin is warm and dry.  Psychiatric: She has a normal mood and affect. Her behavior is normal.   FHR 140, appears to have fetal arrythmia, unable to determine variability, no accels, no decels toco 3-5  MAU Course  Procedures    Assessment and Plan  IUP at [redacted]w[redacted]d Suspect fetal arrythmia FHR non-reactive No cervical change  Tylenol 1000mg  PO hydrate Will check BPP and AFI     Dorothey Oetken M 09/13/2012, 12:24 AM   Pt states HA is better, still uncomfortable w ctx  No  cervical change  FHR became reactive, mod variability +accels and rare mild variable  [place] cancelled  Nubain10mg /phenergan25mg  IM  dc'd home in stable condition rv'd FKC and labor

## 2012-09-14 ENCOUNTER — Inpatient Hospital Stay (HOSPITAL_COMMUNITY): Payer: Medicaid Other | Admitting: Anesthesiology

## 2012-09-14 ENCOUNTER — Encounter (HOSPITAL_COMMUNITY): Payer: Self-pay | Admitting: *Deleted

## 2012-09-14 ENCOUNTER — Encounter (HOSPITAL_COMMUNITY): Payer: Self-pay | Admitting: Anesthesiology

## 2012-09-14 MED ORDER — BISACODYL 10 MG RE SUPP: 10.0000 mg | Freq: Every day | RECTAL | Status: DC | PRN

## 2012-09-14 MED ORDER — TETANUS-DIPHTH-ACELL PERTUSSIS 5-2.5-18.5 LF-MCG/0.5 IM SUSP
0.5000 mL | Freq: Once | INTRAMUSCULAR | Status: AC
  Administered 2012-09-14: 0.5 mL via INTRAMUSCULAR
  Filled 2012-09-14: qty 0.5

## 2012-09-14 MED ORDER — ONDANSETRON HCL 4 MG PO TABS: 4.0000 mg | ORAL_TABLET | ORAL | Status: DC | PRN

## 2012-09-14 MED ORDER — BENZOCAINE-MENTHOL 20-0.5 % EX AERO
1.0000 "application " | INHALATION_SPRAY | CUTANEOUS | Status: DC | PRN
  Filled 2012-09-14: qty 56

## 2012-09-14 MED ORDER — SIMETHICONE 80 MG PO CHEW: 80.0000 mg | CHEWABLE_TABLET | ORAL | Status: DC | PRN

## 2012-09-14 MED ORDER — IBUPROFEN 600 MG PO TABS
600.0000 mg | ORAL_TABLET | Freq: Four times a day (QID) | ORAL | Status: DC
  Administered 2012-09-14 – 2012-09-15 (×5): 600 mg via ORAL
  Filled 2012-09-14 (×5): qty 1

## 2012-09-14 MED ORDER — PRENATAL MULTIVITAMIN CH
1.0000 | ORAL_TABLET | Freq: Every day | ORAL | Status: DC
  Administered 2012-09-15: 1 via ORAL
  Filled 2012-09-14: qty 1

## 2012-09-14 MED ORDER — DIBUCAINE 1 % RE OINT: 1.0000 "application " | TOPICAL_OINTMENT | RECTAL | Status: DC | PRN

## 2012-09-14 MED ORDER — ZOLPIDEM TARTRATE 5 MG PO TABS: 5.0000 mg | ORAL_TABLET | Freq: Every evening | ORAL | Status: DC | PRN

## 2012-09-14 MED ORDER — FLEET ENEMA 7-19 GM/118ML RE ENEM: 1.0000 | ENEMA | Freq: Every day | RECTAL | Status: DC | PRN

## 2012-09-14 MED ORDER — DIPHENHYDRAMINE HCL 25 MG PO CAPS: 25.0000 mg | ORAL_CAPSULE | Freq: Four times a day (QID) | ORAL | Status: DC | PRN

## 2012-09-14 MED ORDER — SENNOSIDES-DOCUSATE SODIUM 8.6-50 MG PO TABS
2.0000 | ORAL_TABLET | Freq: Every day | ORAL | Status: DC
  Administered 2012-09-14: 2 via ORAL

## 2012-09-14 MED ORDER — ONDANSETRON HCL 4 MG/2ML IJ SOLN: 4.0000 mg | INTRAMUSCULAR | Status: DC | PRN

## 2012-09-14 MED ORDER — WITCH HAZEL-GLYCERIN EX PADS: 1.0000 "application " | MEDICATED_PAD | CUTANEOUS | Status: DC | PRN

## 2012-09-14 MED ORDER — LANOLIN HYDROUS EX OINT: TOPICAL_OINTMENT | CUTANEOUS | Status: DC | PRN

## 2012-09-14 MED ORDER — SODIUM BICARBONATE 8.4 % IV SOLN
INTRAVENOUS | Status: DC | PRN
  Administered 2012-09-14: 5 mL via EPIDURAL

## 2012-09-14 MED ORDER — OXYCODONE-ACETAMINOPHEN 5-325 MG PO TABS: 1.0000 | ORAL_TABLET | ORAL | Status: DC | PRN

## 2012-09-14 MED ORDER — MEASLES, MUMPS & RUBELLA VAC ~~LOC~~ INJ
0.5000 mL | INJECTION | Freq: Once | SUBCUTANEOUS | Status: DC
  Filled 2012-09-14: qty 0.5

## 2012-09-14 NOTE — Anesthesia Preprocedure Evaluation (Signed)

## 2012-09-14 NOTE — Anesthesia Postprocedure Evaluation (Signed)
  Anesthesia Post-op Note  Patient: Robin [name]  Procedure(s) Performed: * No procedures listed *  Patient Location: PACU and Mother/Baby  Anesthesia Type:Epidural  Level of Consciousness: awake, alert  and oriented  Airway and Oxygen Therapy: Patient Spontanous Breathing  Post-op Pain: mild  Post-op Assessment: Post-op Vital signs reviewed, Patient's Cardiovascular Status Stable, No headache, No backache, No residual numbness and No residual motor weakness  Post-op Vital Signs: Reviewed and stable  Complications: No apparent anesthesia complications

## 2012-09-14 NOTE — Progress Notes (Signed)
Patient ID: Caryn Bee, female   DOB: [birth year], 22 y.o.   MRN: [id number] Pt is 22 y.o. and at [redacted]w[redacted]d   .Subjective:  Pt breathing through ctx, family supportive at bs, just rcv'd 2nd dose of fentanyl, now requesting epidural   Objective: BP 121/70  Pulse 93  Temp 98.2 F (36.8 C) (Oral)  Resp 18  Ht 5\' 5"  (1.651 m)  Wt 201 lb (91.173 kg)  BMI 33.45 kg/m2  SpO2 100%  LMP 12/06/2011      FHT:  140, mod variability, +accels, no decels  UC:   regular, every 2-3  minutes SVE:   4-5/100/0 SROM w clear fluid,     Assessment: Fetal Wellbeing:  Category I Pain Control:  Fentanyl Spontaneous labor, progressing normally    Plan:  Epidural now Expectant mgmnt    Update physician PRN  Malissa Hippo 09/14/2012, 12:58 AM

## 2012-09-14 NOTE — Anesthesia Procedure Notes (Signed)

## 2012-09-14 NOTE — Progress Notes (Signed)
UR chart review completed.  

## 2012-09-14 NOTE — Progress Notes (Signed)
Patient ID: Caryn Bee, female   DOB: [birth year], 23 y.o.   MRN: [id number] Pt is 22 y.o. and at [redacted]w[redacted]d   .Subjective:  Comfortable now w epidural, feels some pressure, but no strong urge to push   Objective: BP 102/55  Pulse 101  Temp 98.2 F (36.8 C) (Oral)  Resp 18  Ht 5\' 5"  (1.651 m)  Wt 201 lb (91.173 kg)  BMI 33.45 kg/m2  SpO2 100%  LMP 12/06/2011      FHT:  140, minimal-mod variability, 10x10 accels, no decels  UC:   regular, every 2-3 minutes SVE:   Dilation: 10 Effacement (%): 100 Station: +2 Exam by:: Lomax Poehler CNM    Assessment: Fetal Wellbeing:  Category I and Category II Pain Control:  Epidural Spontaneous labor, progressing normally    Plan:  Labor down,  Push w stronger urge    Update physician PRN  Malissa Hippo 09/14/2012, 4:02 AM

## 2012-09-15 LAB — CBC
Platelets: 151 10*3/uL (ref 150–400)
RDW: 13.2 % (ref 11.5–15.5)
WBC: 10.7 10*3/uL — ABNORMAL HIGH (ref 4.0–10.5)

## 2012-09-15 MED ORDER — OXYCODONE-ACETAMINOPHEN 5-325 MG PO TABS: 1.0000 | ORAL_TABLET | ORAL | Status: DC | PRN

## 2012-09-15 MED ORDER — IBUPROFEN 800 MG PO TABS: 800.0000 mg | ORAL_TABLET | Freq: Three times a day (TID) | ORAL | Status: DC | PRN

## 2012-09-15 MED ORDER — POLYSACCHARIDE IRON COMPLEX 150 MG PO CAPS
150.0000 mg | ORAL_CAPSULE | Freq: Two times a day (BID) | ORAL | Status: DC
  Administered 2012-09-15: 150 mg via ORAL
  Filled 2012-09-15 (×3): qty 1

## 2012-09-15 MED ORDER — FERROUS SULFATE 325 (65 FE) MG PO TABS: 325.0000 mg | ORAL_TABLET | Freq: Two times a day (BID) | ORAL | Status: DC

## 2012-09-15 NOTE — Progress Notes (Signed)
Patient ID: Robin [name], female   DOB: 1990/08/27, 22 y.o.   MRN: [id number] Post Partum Day 1   Subjective: no complaints, up ad lib without syncope, voiding, tolerating PO, + flatus, Pain well controlled  Working on BF Mood stable, bonding well Contraception: undecided    Objective: Blood pressure 109/66, pulse 90, temperature 98 F (36.7 C), temperature source Oral, resp. rate 18, height 5\' 5"  (1.651 [name]), weight 201 lb (91.173 kg), last menstrual period 12/06/2011, SpO2 100.00%, unknown if currently breastfeeding.  Physical Exam:  General: no distress Lungs: CTAB Heart: RRR Lochia: appropriate Uterine Fundus: firm Perineum: DVT Evaluation: No evidence of DVT seen on physical exam.   Basename 09/15/12 0525 09/13/12 2220  HGB 8.8* 13.1  HCT 25.7* 37.5    Assessment/Plan: Plan for discharge tomorrow and Breastfeeding PP anemia, FE supplement ordered  Check orthostatics  Plans outpatient  circumcision       LOS: 2 days   Robin [name] 09/15/2012, 6:16 AM

## 2012-09-15 NOTE — Progress Notes (Signed)
Labs and vital signs reviewed. Orthostatics are acceptable. Continue observation.  Dr. Stefano Gaul

## 2012-09-15 NOTE — Discharge Summary (Signed)
Vaginal Delivery Discharge Summary  Robin [name]  DOB:    1990/11/01 MRN:    [id number] CSN:    [id number]  Date of admission:                  09/13/2012  Date of discharge:                   09/15/2012  Procedures this admission:  09/13/2012  Normal spontaneous vaginal delivery by Sanda Klein certified nurse midwife  Newborn Data:  Live born female  Birth Weight: 7 lb 8.1 oz (3405 g) APGAR: 8, 9  Home with mother. Name: Robin [name]  History of Present Illness:  Ms. Robin [name] is a 22 y.o. female, G2P1011, who presents at [redacted]w[redacted]d weeks gestation. The patient has been followed at the [facility] and Gynecology division of [facility]. She was admitted onset of labor. Her pregnancy has been complicated by: First trimester bleeding that subsequently resolved, and he fetal arrhythmia that was said to be of no consequence.  Hospital course:  The patient was admitted for labor.   Her labor was not complicated. She proceeded to have a vaginal delivery of a healthy infant. Her delivery was not complicated. Her postpartum course was not complicated. The patient was not orthostatic. The pediatric team was aware of the arrhythmia during the pregnancy and felt that the baby was fine and ready for discharge on day 1. She was discharged to home on postpartum day 1 doing well.  Feeding:  breast  Contraception:  Nexplanon  Discharge hemoglobin:  Hemoglobin  Date Value Range Status  09/15/2012 8.8* 12.0 - 15.0 g/dL Final     DELTA CHECK NOTED     REPEATED TO VERIFY     HCT  Date Value Range Status  09/15/2012 25.7* 36.0 - 46.0 % Final    Intrapartum Procedures: spontaneous vaginal delivery Postpartum Procedures: none Complications-Operative and Postpartum: First degree perineal laceration  Discharge Diagnoses: Term Pregnancy-delivered and Anemia  Discharge Information:  Activity:           pelvic rest Diet:                 routine Medications: PNV, Ibuprofen, Colace, Iron and Percocet Condition:      stable and improved Instructions:  Care after vaginal delivery and postpartum depression Discharge to: home  Follow-up Information    Follow up with [facility]. In 4 weeks.   Contact information:   [address] [place] [address]           Janine Limbo 09/15/2012

## 2012-09-19 ENCOUNTER — Encounter: Payer: Medicaid Other | Admitting: Obstetrics and Gynecology

## 2012-09-22 ENCOUNTER — Ambulatory Visit (HOSPITAL_COMMUNITY): Payer: Medicaid Other

## 2012-10-05 ENCOUNTER — Telehealth: Payer: Self-pay | Admitting: Obstetrics and Gynecology

## 2012-10-05 NOTE — Telephone Encounter (Signed)
Tc to pt rgd circumcision this am. Pt would like to know how much tylenol to give the baby, advised pt to call pediatrician for correct dosage. Pt voiced understanding.

## 2012-10-14 ENCOUNTER — Encounter: Payer: Self-pay | Admitting: Obstetrics and Gynecology

## 2012-10-14 ENCOUNTER — Ambulatory Visit: Payer: Medicaid Other | Admitting: Obstetrics and Gynecology

## 2012-10-14 DIAGNOSIS — Z309 Encounter for contraceptive management, unspecified: Secondary | ICD-10-CM

## 2012-10-14 NOTE — Progress Notes (Signed)
Date of delivery: 09/14/12 Female Name: Cameran  Vaginal delivery:yes Cesarean section:no Tubal ligation:no GDM:no Breast Feeding:yes Bottle Feeding:no Post-Partum Blues:no Abnormal pap:no Normal GU function: yes Normal GI function:yes Returning to work:yes EPDS: 6  No complaints.  Wants nexplanon.  Filed Vitals:   10/14/12 0906  BP: 100/64  Temp: 98 F (36.7 C)   ROS: noncontributory  Pelvic exam:  VULVA: normal appearing vulva with no masses, tenderness or lesions,  VAGINA: normal appearing vagina with normal color and discharge, no lesions, CERVIX: normal appearing cervix without discharge or lesions,  UTERUS: uterus is normal size, shape, consistency and nontender,  ADNEXA: normal adnexa in size, nontender and no masses.  A/P SEs of nexplanon discussed S/p NSVD 4kws ago Plan nexplanon insertion in 2wks.  May resume IC after insertion.

## 2012-10-24 ENCOUNTER — Encounter: Payer: Medicaid Other | Admitting: Obstetrics and Gynecology

## 2012-10-28 ENCOUNTER — Encounter: Payer: Medicaid Other | Admitting: Obstetrics and Gynecology

## 2013-06-05 IMAGING — US US FETAL BPP W/O NONSTRESS
2 series · 14 of 28 positions shown · non-contrast
Comparison: none

CLINICAL DATA: Arrhythmia.  Check fluid.

[Series 1: us fetal bpp w/o nonstress · non-contrast · 24 acquisitions, 12 frames shown (1 of 2)]
[im 2/24]
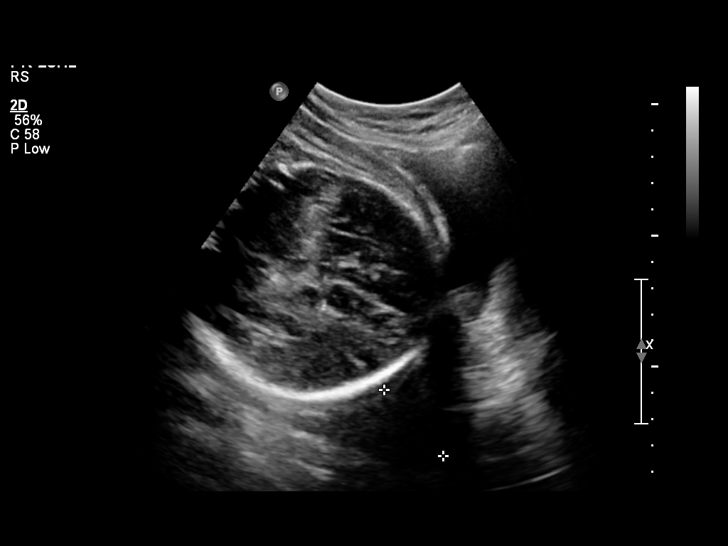
[im 4/24]
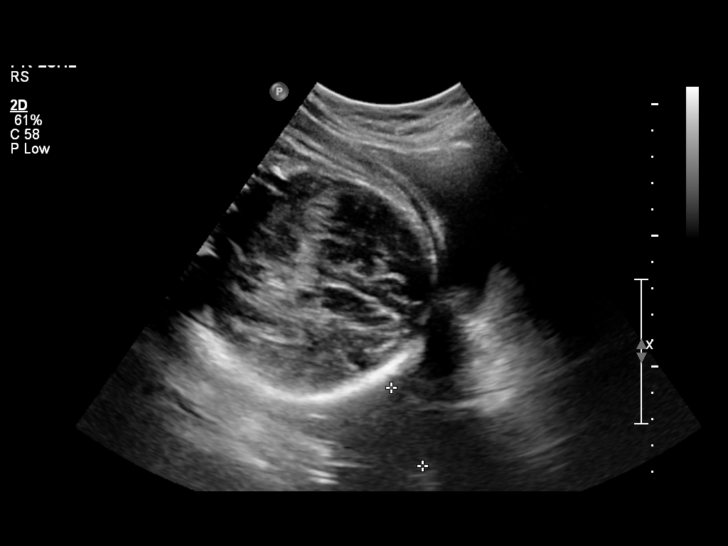
[im 6/24]
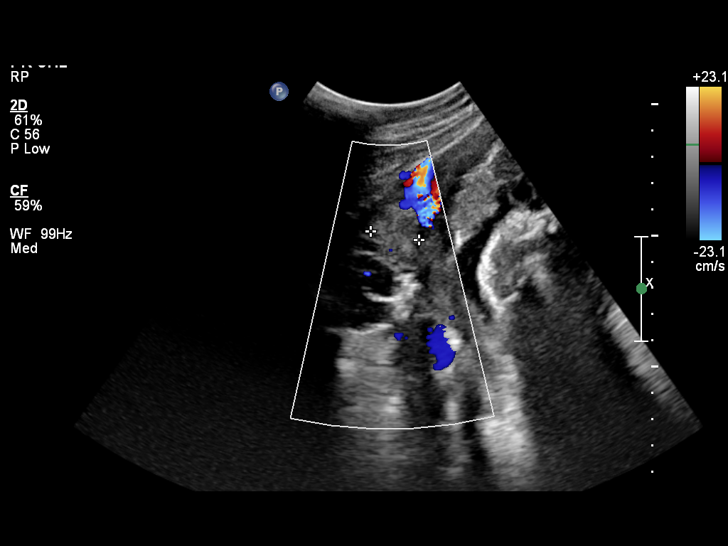
[im 8/24]
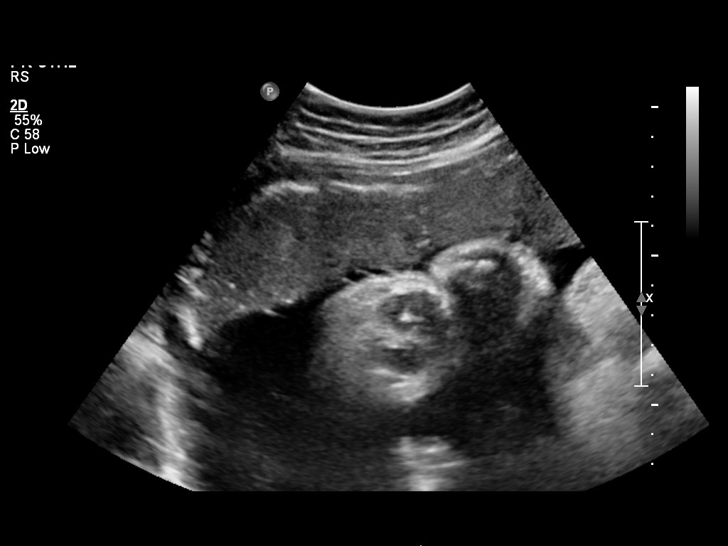
[im 10/24]
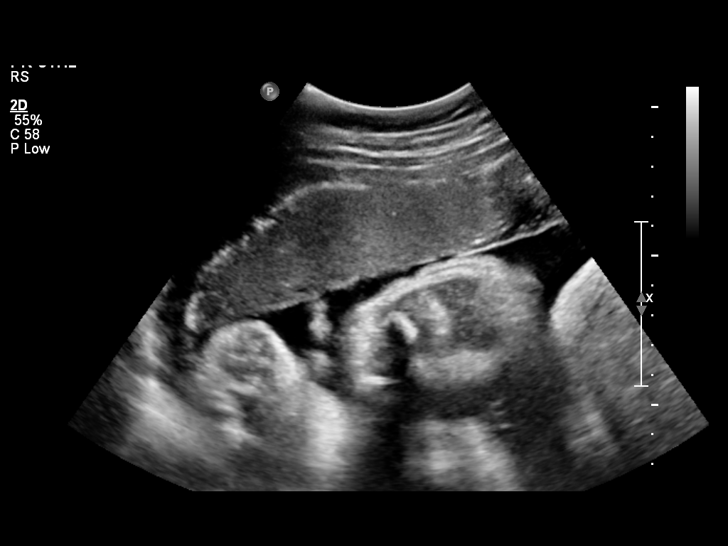
[im 12/24]
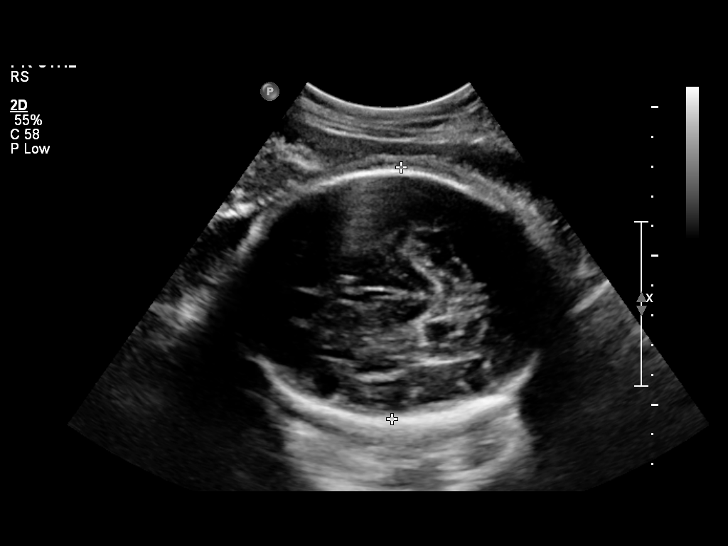
[im 14/24]
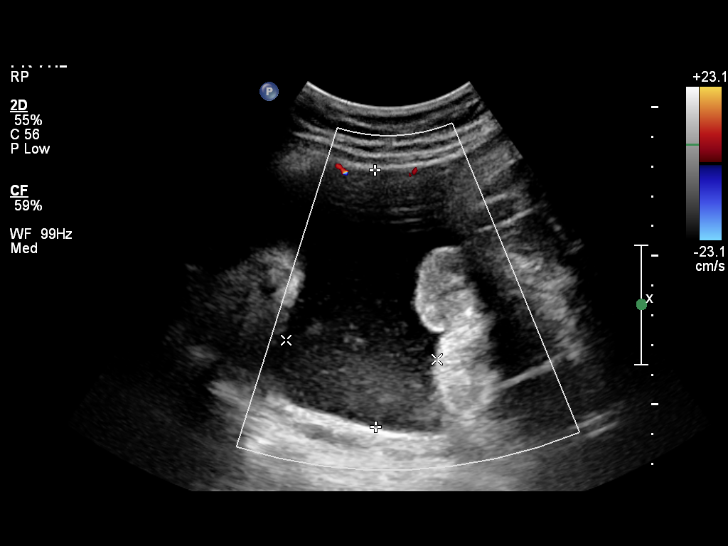
[im 16/24]
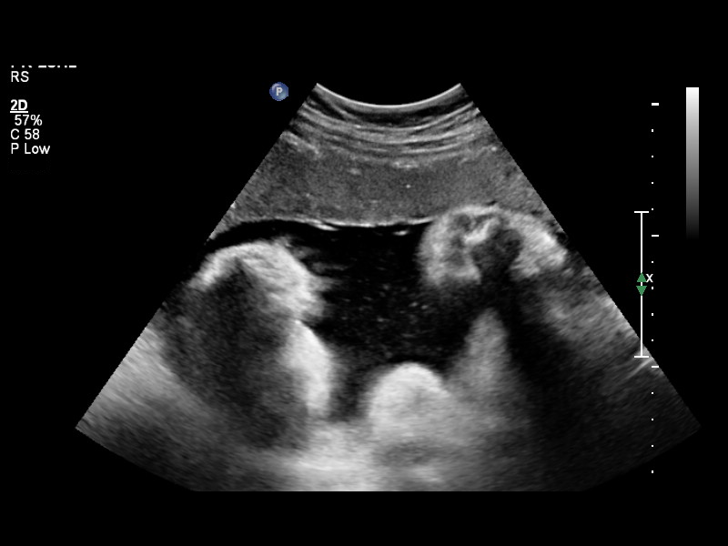
[im 18/24]
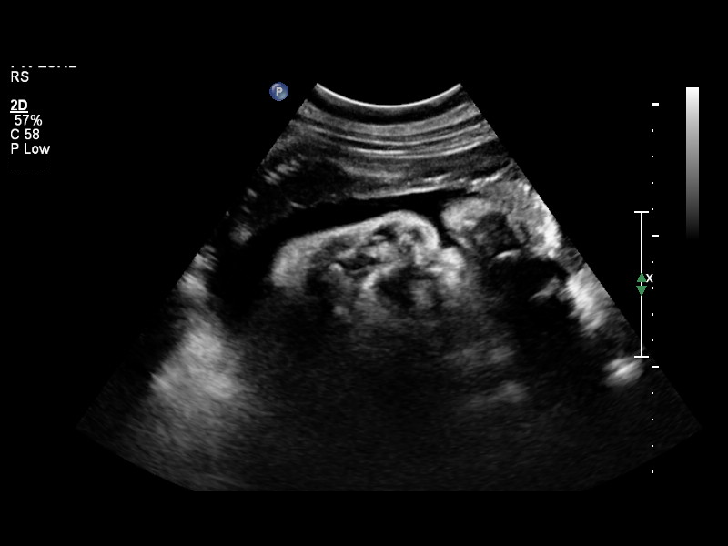
[im 20/24]
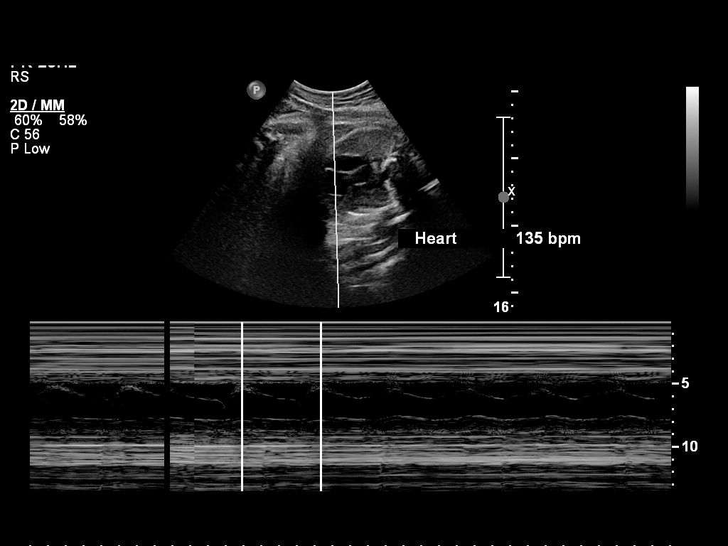
[im 22/24]
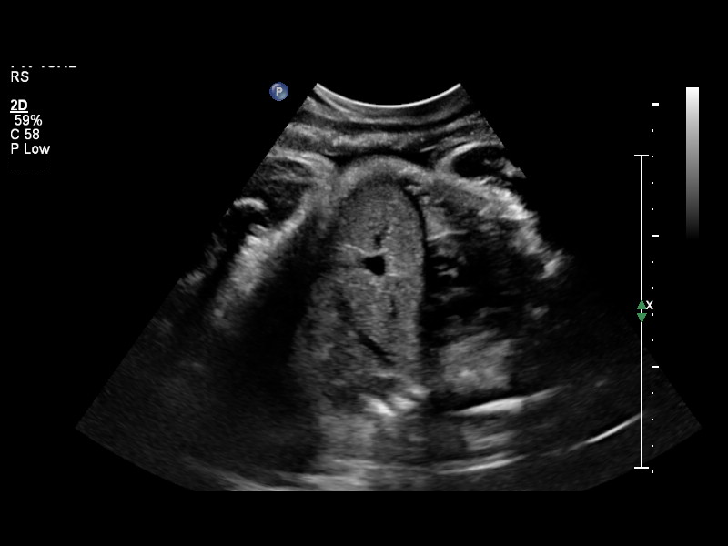
[im 24/24]
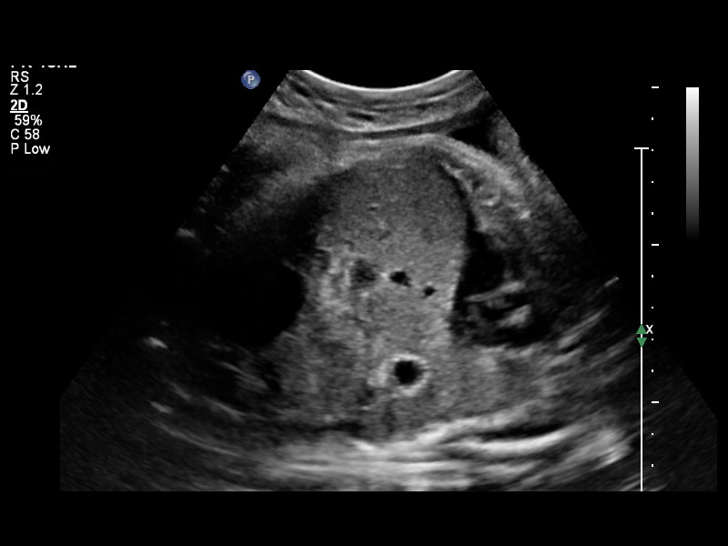

[Series 1: us fetal bpp w/o nonstress · non-contrast · 2 of 4 slices shown (2 of 2)]
[im 2/4]
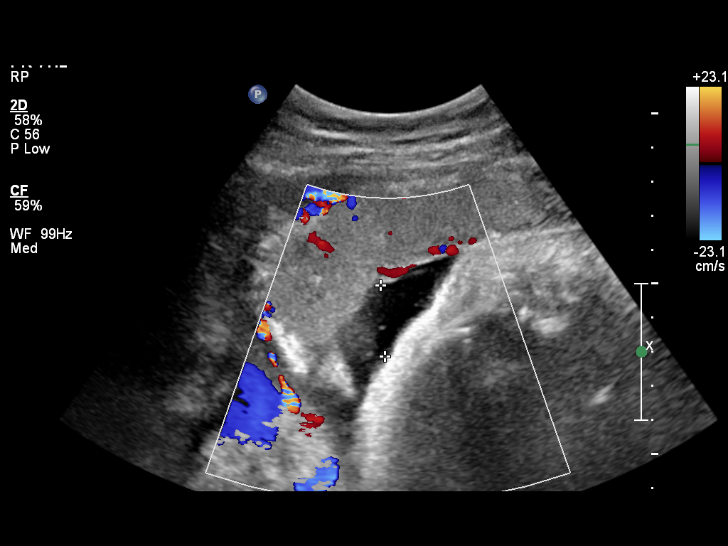
[im 4/4]
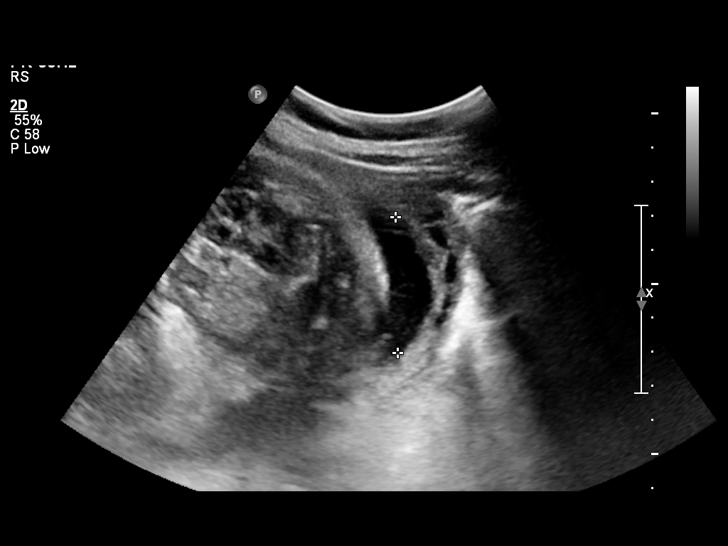

[14 of 28 positions shown; findings below may reference images not displayed]

BIOPHYSICAL PROFILE

Number of Fetuses: 1
Heart Rate: 135 bpm
Presentation: Cephalic
Movement: Present
Placental Location:  Anterior
Previa:  Absent
Amniotic Fluid (Subjective):  Normal

Vertical pocket:  8.5 cm      AFI:  14 cm

BPD:  8.43 cm     34 w  0 d

MATERNAL FINDINGS:
Cervix:  Closed, 3.3 cm. /
Uterus/Adnexae:  Not visualized.

BPP:
Movement:  2      Time:  25
Breathing: 2
Tone:   2
Amniotic Fluid:  2

Total Score:  [DATE]
IMPRESSION: BPP [DATE].  Findings as above.

Recommend followup with non-emergent complete OB 14+ wk US
examination for fetal biometric evaluation and anatomic survey if
not already performed.

## 2013-06-25 IMAGING — US US FETAL BPP W/O NONSTRESS
2 series · 13 of 25 positions shown · non-contrast
Comparison: none

CLINICAL DATA: Nonreactive tracing; fetal arrhythmias.

[Series 1: us fetal bpp w/o nonstress · non-contrast · 24 acquisitions, 12 frames shown (1 of 2)]
[im 1/24]
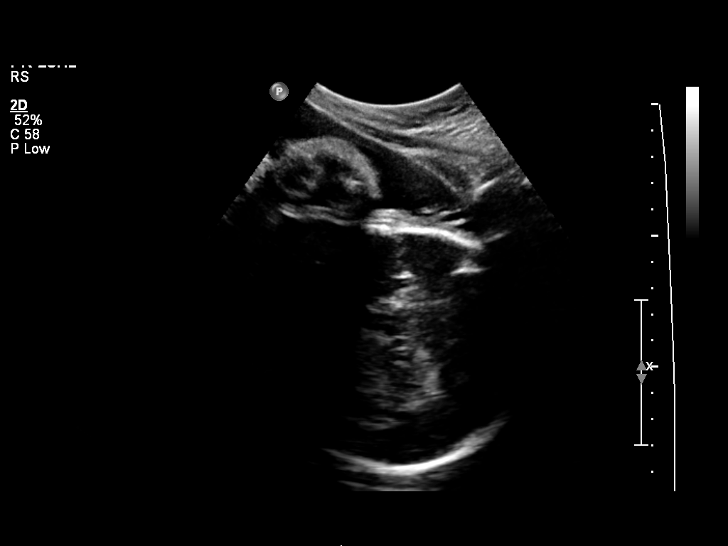
[im 3/24]
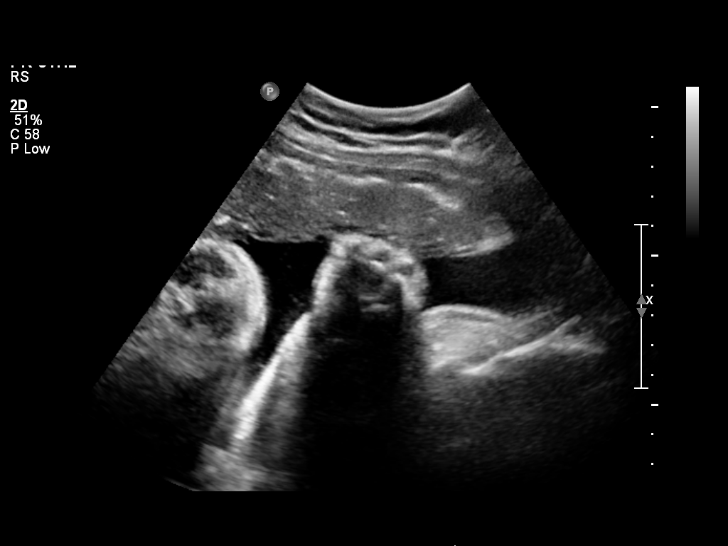
[im 5/24]
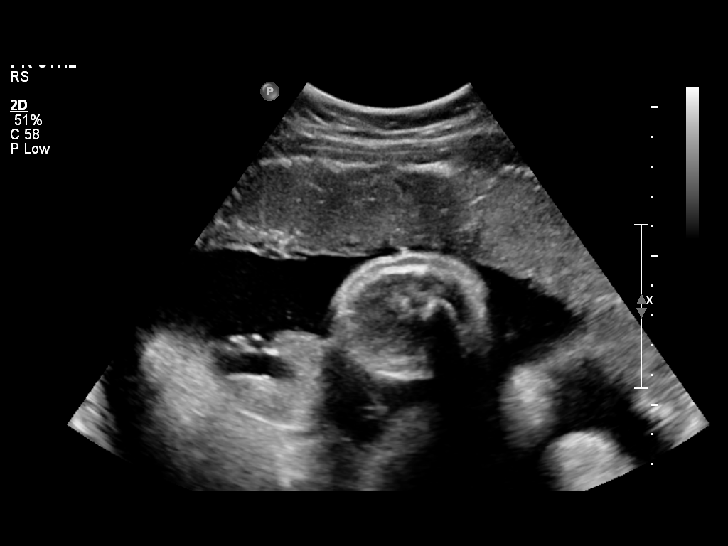
[im 7/24]
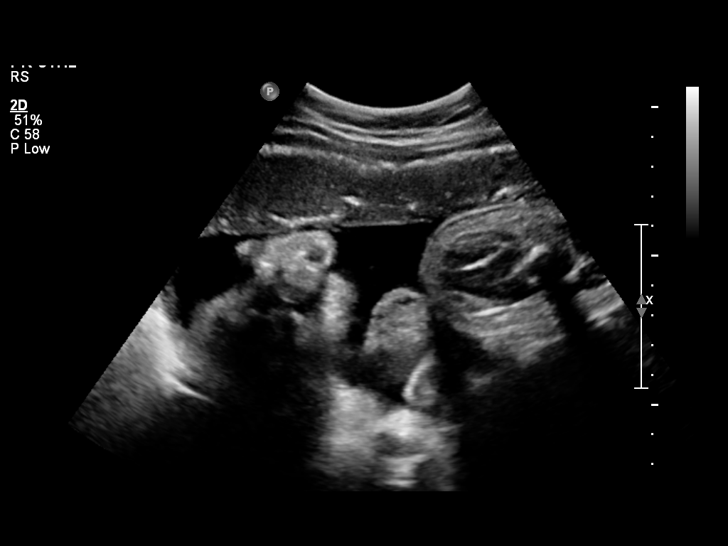
[im 9/24]
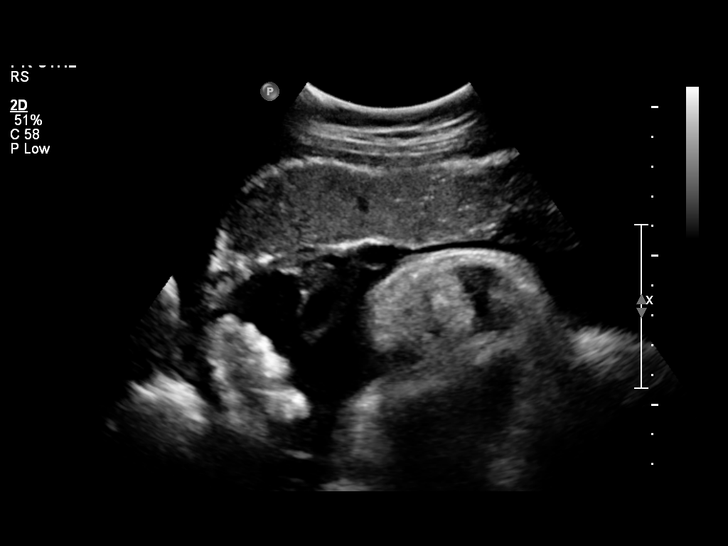
[im 11/24]
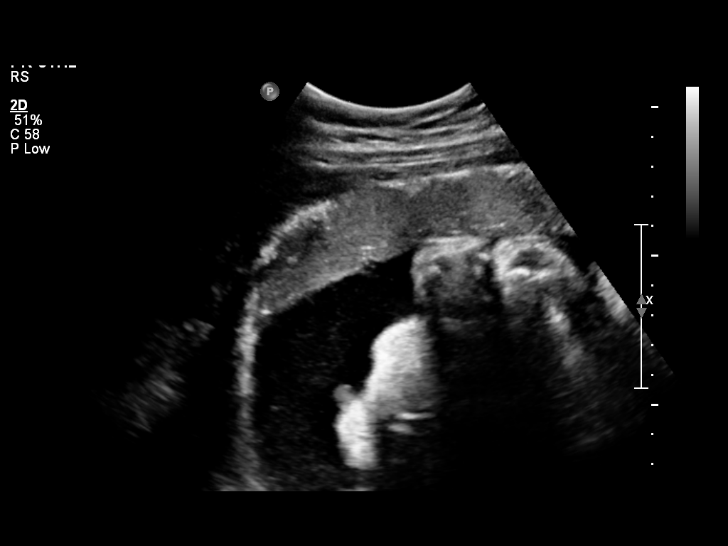
[im 13/24]
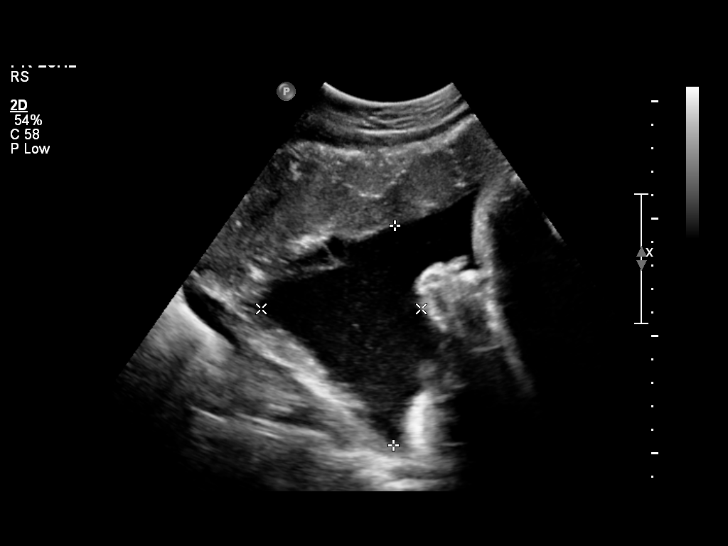
[im 15/24]
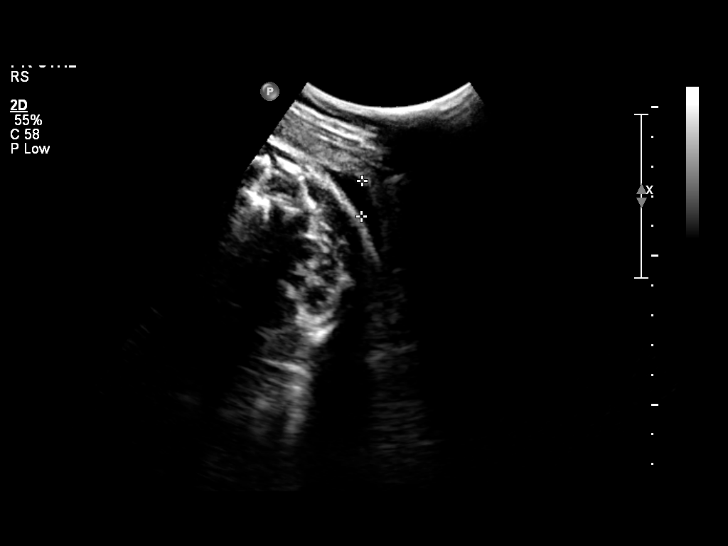
[im 17/24]
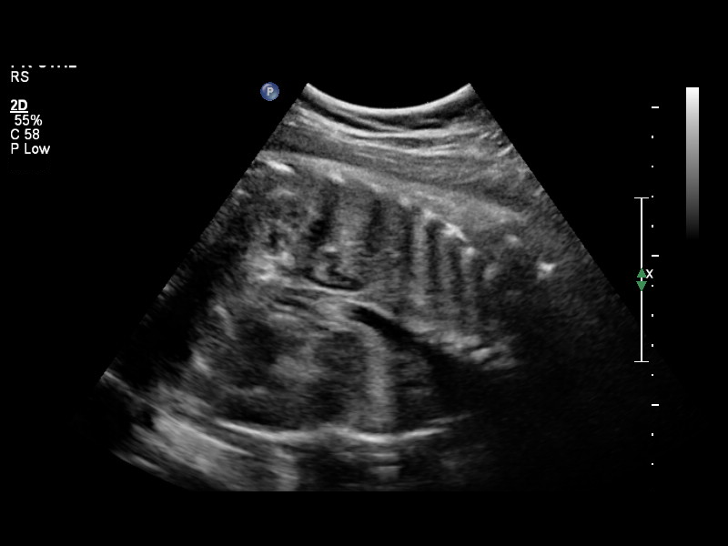
[im 19/24]
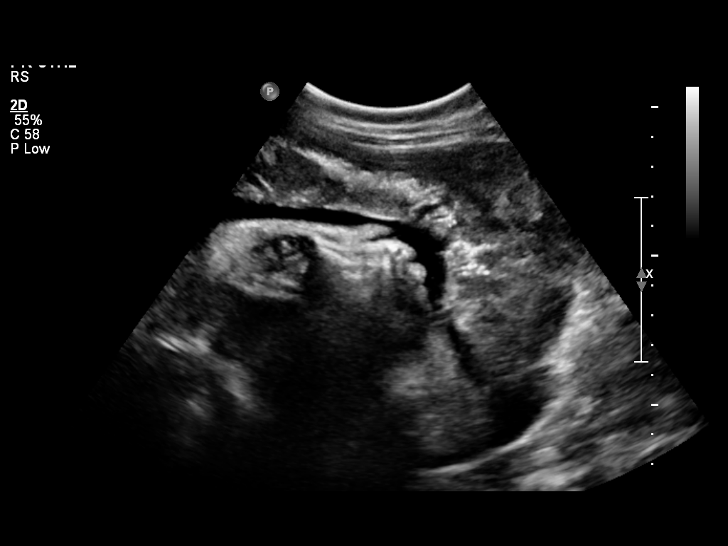
[im 21/24]
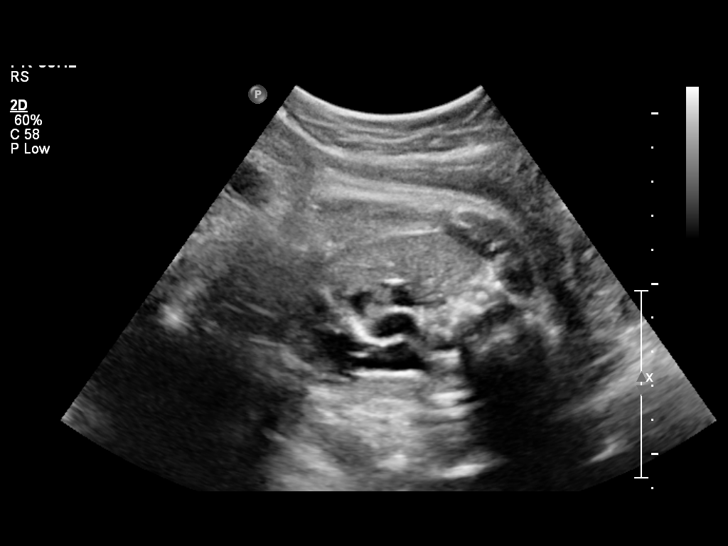
[im 23/24]
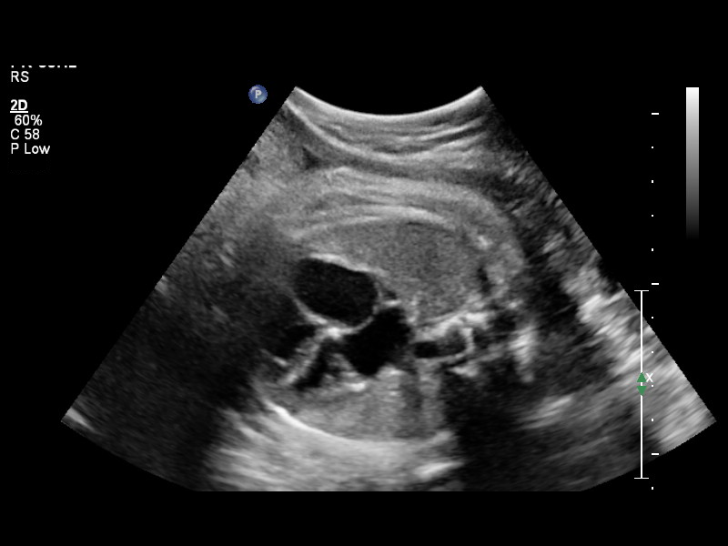

[Series 1: us fetal bpp w/o nonstress · non-contrast · 1 of 1 slices shown (2 of 2)]
[im 1/1]
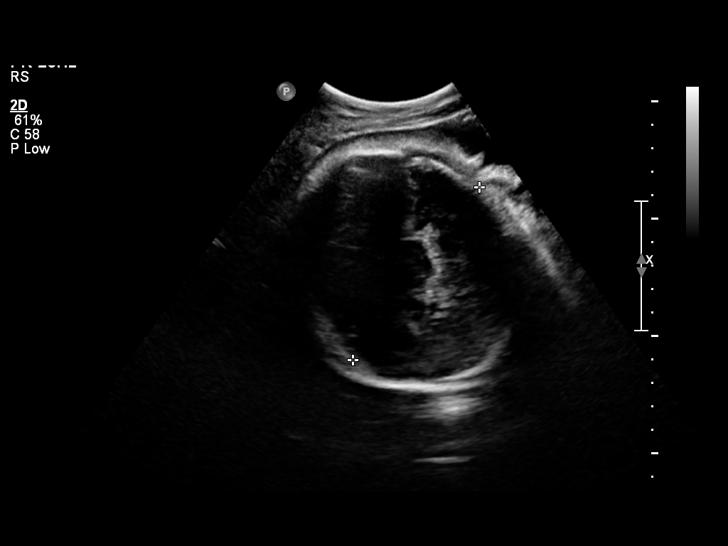

[13 of 25 positions shown; findings below may reference images not displayed]

BIOPHYSICAL PROFILE

LIMITED OBSTETRIC ULTRASOUND

Number of Fetuses: 1
Heart Rate: 158 bpm
Presentation: Cephalic
Movement: Yes
Placental Location:  Anterior
Previa:  No
Amniotic Fluid (Subjective):  Normal

AFI:  17.7 cm (5%ile 7.3 cm, 95%ile 23.9 cm)

BPD:  9.1 cm      37 w  0 d

MATERNAL FINDINGS:
Cervix:  Not characterized on this study. /
Uterus/Adnexae:  Otherwise unremarkable in appearance.

BPP:  6 / 8
Movement:  2 / 2  Time:  30 minutes
Breathing: 0 / 2
Tone:   2 / 2
Amniotic Fluid:  2 / 2

Total Score:  6 / 8

Note is made of a significant fetal arrhythmia.  There is also
suspicion for abnormal cardiac configuration, with apparent minimal
flow through the foramen ovale and poor characterization of the
great vessels.
IMPRESSION: 1.  Biophysical profile score of [DATE]; no fetal breathing visualized
after 30 minutes.
2.  Fetal arrhythmia noted.  Suspicion for abnormal cardiac
configuration on provided images, with poor characterization of the
great vessels and apparent minimal flow through the foramen ovale.
Fetal echocardiogram would be helpful for further evaluation, when
and as deemed clinically appropriate.

Recommend followup with non-emergent complete OB 14+ wk US
examination for fetal biometric evaluation and anatomic survey if
not already performed.

to Tanko-Kristo Balint CNM, who verbally acknowledged these results.

## 2013-08-24 NOTE — L&D Delivery Note (Signed)
Called to room due to pt w/ urge to push. Cervix C/C/+2; pushing commenced shortly after exam.   Delivery Note At 1:39 PM a viable female "Robin [name]" was delivered via Vaginal, Spontaneous Delivery (Presentation: Left Occiput Anterior).  APGAR: 9, 9; weight pending .   Placenta status: Intact, Spontaneous.  Cord: 3 vessels with the following complications: None.  Cord pH: NA  Anesthesia: Epidural  Episiotomy: None Lacerations: None Suture Repair: NA Est. Blood Loss (mL): 300  Mom to postpartum.  Baby to Couplet care / Skin to Skin.  Breastfeeding  Plans Micronor for contraception   [name], Russell Quinney 03/14/2014, 2:06 PM

## 2013-08-29 ENCOUNTER — Inpatient Hospital Stay (HOSPITAL_COMMUNITY)
Admission: AD | Admit: 2013-08-29 | Discharge: 2013-08-29 | Disposition: A | Discharge status: Home / Self Care | Payer: BC Managed Care – PPO | Source: Ambulatory Visit | Attending: Obstetrics and Gynecology | Admitting: Obstetrics and Gynecology

## 2013-08-29 ENCOUNTER — Inpatient Hospital Stay (HOSPITAL_COMMUNITY): Payer: BC Managed Care – PPO

## 2013-08-29 ENCOUNTER — Encounter (HOSPITAL_COMMUNITY): Payer: Self-pay

## 2013-08-29 DIAGNOSIS — M549 Dorsalgia, unspecified: Secondary | ICD-10-CM | POA: Insufficient documentation

## 2013-08-29 DIAGNOSIS — R109 Unspecified abdominal pain: Secondary | ICD-10-CM | POA: Insufficient documentation

## 2013-08-29 DIAGNOSIS — O99891 Other specified diseases and conditions complicating pregnancy: Secondary | ICD-10-CM | POA: Insufficient documentation

## 2013-08-29 DIAGNOSIS — O9989 Other specified diseases and conditions complicating pregnancy, childbirth and the puerperium: Principal | ICD-10-CM

## 2013-08-29 LAB — URINALYSIS, ROUTINE W REFLEX MICROSCOPIC
Bilirubin Urine: NEGATIVE
GLUCOSE, UA: NEGATIVE mg/dL
HGB URINE DIPSTICK: NEGATIVE
KETONES UR: NEGATIVE mg/dL
LEUKOCYTES UA: NEGATIVE
Nitrite: NEGATIVE
PH: 7 (ref 5.0–8.0)
PROTEIN: NEGATIVE mg/dL
Specific Gravity, Urine: 1.01 (ref 1.005–1.030)
Urobilinogen, UA: 0.2 mg/dL (ref 0.0–1.0)

## 2013-08-29 LAB — WET PREP, GENITAL
CLUE CELLS WET PREP: NONE SEEN
TRICH WET PREP: NONE SEEN
Yeast Wet Prep HPF POC: NONE SEEN

## 2013-08-29 LAB — HCG, QUANTITATIVE, PREGNANCY: hCG, Beta Chain, Quant, S: 40004 m[IU]/mL — ABNORMAL HIGH (ref <5)

## 2013-08-29 MED ORDER — IBUPROFEN 600 MG PO TABS
600.0000 mg | ORAL_TABLET | Freq: Once | ORAL | Status: AC
  Administered 2013-08-29: 600 mg via ORAL
  Filled 2013-08-29: qty 1

## 2013-08-29 NOTE — Discharge Instructions (Signed)
Abdominal Pain During Pregnancy °Abdominal discomfort is common in pregnancy. Most of the time, it does not cause harm. There are many causes of abdominal pain. Some causes are more serious than others. Some of the causes of abdominal pain in pregnancy are easily diagnosed. Occasionally, the diagnosis takes time to understand. Other times, the cause is not determined. Abdominal pain can be a sign that something is very wrong with the pregnancy, or the pain may have nothing to do with the pregnancy at all. For this reason, always tell your caregiver if you have any abdominal discomfort. °CAUSES °Common and harmless causes of abdominal pain include: °· Constipation. °· Excess gas and bloating. °· Round ligament pain. This is pain that is felt in the folds of the groin. °· The position the baby or placenta is in. °· Baby kicks. °· Braxton-Hicks contractions. These are mild contractions that do not cause cervical dilation. °Serious causes of abdominal pain include: °· Ectopic pregnancy. This happens when a fertilized egg implants outside of the uterus. °· Miscarriage. °· Preterm labor. This is when labor starts at less than 37 weeks of pregnancy. °· Placental abruption. This is when the placenta partially or completely separates from the uterus. °· Preeclampsia. This is often associated with high blood pressure and has been referred to as "toxemia in pregnancy." °· Uterine or amniotic fluid infections.  °Causes unrelated to pregnancy include: °· Urinary tract infection. °· Gallbladder stones or inflammation. °· Hepatitis or other liver illness. °· Intestinal problems, stomach flu, food poisoning, or ulcer. °· Appendicitis. °· Kidney (renal) stones. °· Kidney infection (pylonephritis). °HOME CARE INSTRUCTIONS  °For mild pain: °· Do not have sexual intercourse or put anything in your vagina until your symptoms go away completely. °· Get plenty of rest until your pain improves. If your pain does not improve in 1 hour, call  your caregiver. °· Drink clear fluids if you feel nauseous. Avoid solid food as long as you are uncomfortable or nauseous. °· Only take medicine as directed by your caregiver. °· Keep all follow-up appointments with your caregiver. °SEEK IMMEDIATE MEDICAL CARE IF: °· You are bleeding, leaking fluid, or passing tissue from the vagina. °· You have increasing pain or cramping. °· You have persistent vomiting. °· You have painful or bloody urination. °· You have a fever. °· You notice a decrease in your baby's movements. °· You have extreme weakness or feel faint. °· You have shortness of breath, with or without abdominal pain. °· You develop a severe headache with abdominal pain. °· You have abnormal vaginal discharge with abdominal pain. °· You have persistent diarrhea. °· You have abdominal pain that continues even after rest, or gets worse. °MAKE SURE YOU:  °· Understand these instructions. °· Will watch your condition. °· Will get help right away if you are not doing well or get worse. °Document Released: 08/10/2005 Document Revised: 11/02/2011 Document Reviewed: 03/09/2013 °ExitCare® Patient Information ©2014 [employer]. ° °

## 2013-08-29 NOTE — MAU Provider Note (Addendum)
  History     CSN: [id number]  Arrival date and time: 08/29/13 1318   First Provider Initiated Contact with Patient 08/29/13 1611      Chief Complaint  Patient presents with  . Abdominal Pain  . Back Pain   HPI Pt presents c/o cramping that started early afternoon in lower, mid abdomen/pelvis.  She denies any vb or abnl discharge.  No n/v/f/c/d.  Pt had u/s at pregnancy care center at about [redacted]wks pregnant that showed IUP and was c/w her LMP of 06/10/13 per pt.  OB History   Grav Para Term Preterm Abortions TAB SAB Ect Mult Living   3 1 1  1  1   1       Past Medical History  Diagnosis Date  . No pertinent past medical history   . PCOS (polycystic ovarian syndrome)   . Miscarriage     Past Surgical History  Procedure Laterality Date  . Dilation and evacuation  06/18/2011    Procedure: DILATATION AND EVACUATION (D&E);  Surgeon: Hal MoralesVanessa P Haygood, MD;  Location: [facility];  Service: Gynecology;  Laterality: N/A;    Family History  Problem Relation Age of Onset  . Arthritis Mother   . Diabetes Maternal Aunt   . Diabetes Maternal Grandmother   . Cancer Maternal Grandmother     BREAST; POST MENOPAUSAL  . Hyperlipidemia Maternal Grandmother   . Hypertension Maternal Grandmother   . Anesthesia problems Neg Hx   . Sickle cell trait Father   . Sickle cell trait Sister   . Diabetes Maternal Uncle   . Kidney disease Maternal Uncle     DIALYSIS  . Alcohol abuse Maternal Uncle   . Drug abuse Maternal Uncle     History  Substance Use Topics  . Smoking status: Never Smoker   . Smokeless tobacco: Never Used  . Alcohol Use: No    Allergies: No Known Allergies  Prescriptions prior to admission  Medication Sig Dispense Refill  . Prenatal Vit-Fe Fumarate-FA (PRENATAL MULTIVITAMIN) TABS Take 1 tablet by mouth daily.  90 tablet  3    ROS Non-contributory  Physical Exam   Blood pressure 116/70, pulse 76, temperature 99 F (37.2 C), temperature source Oral, resp. rate 18,  height 5\' 6"  (1.676 m), weight 90.901 kg (200 lb 6.4 oz), SpO2 100.00%.  Physical Exam  Abd soft, NT No bil CVAT VE closed/long/thick, no abnl findings, NT, no CMT FHR 170  MAU Course  Procedures  Wet prep - neg UA and Cx - UA neg U/S - IUP with FHR 170, no SCH, nl ovaries   Assessment and Plan  P1 at 11 6/7 wks with cramping improved s/p ibuprofen with nl exam.  SAB precautions discussed.  Pt instructed to keep appt on 09/07/13 in office.  Purcell NailsROBERTS,Marguis Mathieson Y 08/29/2013, 5:42 PM

## 2013-08-29 NOTE — MAU Note (Signed)
Patient states she started having lower abdominal and back pain about 2 hours ago. Denies bleeding or discharge.

## 2013-09-07 LAB — OB RESULTS CONSOLE ANTIBODY SCREEN: Antibody Screen: NEGATIVE

## 2013-09-07 LAB — OB RESULTS CONSOLE HIV ANTIBODY (ROUTINE TESTING): HIV: NONREACTIVE

## 2013-09-07 LAB — OB RESULTS CONSOLE RPR: RPR: NONREACTIVE

## 2013-09-07 LAB — OB RESULTS CONSOLE HEPATITIS B SURFACE ANTIGEN: Hepatitis B Surface Ag: NEGATIVE

## 2013-09-07 LAB — OB RESULTS CONSOLE RUBELLA ANTIBODY, IGM: RUBELLA: IMMUNE

## 2013-09-07 LAB — OB RESULTS CONSOLE ABO/RH: RH Type: POSITIVE

## 2014-02-11 ENCOUNTER — Encounter (HOSPITAL_COMMUNITY): Payer: Self-pay | Admitting: *Deleted

## 2014-02-11 ENCOUNTER — Inpatient Hospital Stay (HOSPITAL_COMMUNITY)
Admission: AD | Admit: 2014-02-11 | Discharge: 2014-02-11 | Disposition: A | Discharge status: Home / Self Care | Payer: Medicaid Other | Source: Ambulatory Visit | Attending: Obstetrics and Gynecology | Admitting: Obstetrics and Gynecology

## 2014-02-11 DIAGNOSIS — O4700 False labor before 37 completed weeks of gestation, unspecified trimester: Secondary | ICD-10-CM | POA: Diagnosis not present

## 2014-02-11 DIAGNOSIS — E669 Obesity, unspecified: Secondary | ICD-10-CM | POA: Diagnosis not present

## 2014-02-11 DIAGNOSIS — O99210 Obesity complicating pregnancy, unspecified trimester: Secondary | ICD-10-CM

## 2014-02-11 LAB — OB RESULTS CONSOLE GBS: STREP GROUP B AG: NEGATIVE

## 2014-02-11 NOTE — MAU Note (Signed)
Patient presents with complaint of contractions since 2100 last night.

## 2014-02-11 NOTE — Discharge Instructions (Signed)
Braxton Hicks Contractions °Contractions of the uterus can occur throughout pregnancy. Contractions are not always a sign that you are in labor.  °WHAT ARE BRAXTON HICKS CONTRACTIONS?  °Contractions that occur before labor are called Braxton Hicks contractions, or false labor. Toward the end of pregnancy (32-34 weeks), these contractions can develop more often and may become more forceful. This is not true labor because these contractions do not result in opening (dilatation) and thinning of the cervix. They are sometimes difficult to tell apart from true labor because these contractions can be forceful and people have different pain tolerances. You should not feel embarrassed if you go to the hospital with false labor. Sometimes, the only way to tell if you are in true labor is for your health care provider to look for changes in the cervix. °If there are no prenatal problems or other health problems associated with the pregnancy, it is completely safe to be sent home with false labor and await the onset of true labor. °HOW CAN YOU TELL THE DIFFERENCE BETWEEN TRUE AND FALSE LABOR? °False Labor °· The contractions of false labor are usually shorter and not as hard as those of true labor.   °· The contractions are usually irregular.   °· The contractions are often felt in the front of the lower abdomen and in the groin.   °· The contractions may go away when you walk around or change positions while lying down.   °· The contractions get weaker and are shorter lasting as time goes on.   °· The contractions do not usually become progressively stronger, regular, and closer together as with true labor.   °True Labor °· Contractions in true labor last 30-70 seconds, become very regular, usually become more intense, and increase in frequency.   °· The contractions do not go away with walking.   °· The discomfort is usually felt in the top of the uterus and spreads to the lower abdomen and low back.   °· True labor can be  determined by your health care provider with an exam. This will show that the cervix is dilating and getting thinner.   °WHAT TO REMEMBER °· Keep up with your usual exercises and follow other instructions given by your health care provider.   °· Take medicines as directed by your health care provider.   °· Keep your regular prenatal appointments.   °· Eat and drink lightly if you think you are going into labor.   °· If Braxton Hicks contractions are making you uncomfortable:   °¨ Change your position from lying down or resting to walking, or from walking to resting.   °¨ Sit and rest in a tub of warm water.   °¨ Drink 2-3 glasses of water. Dehydration may cause these contractions.   °¨ Do slow and deep breathing several times an hour.   °WHEN SHOULD I SEEK IMMEDIATE MEDICAL CARE? °Seek immediate medical care if: °· Your contractions become stronger, more regular, and closer together.   °· You have fluid leaking or gushing from your vagina.   °· You have a fever.   °· You pass blood-tinged mucus.   °· You have vaginal bleeding.   °· You have continuous abdominal pain.   °· You have low back pain that you never had before.   °· You feel your baby's head pushing down and causing pelvic pressure.   °· Your baby is not moving as much as it used to.   °Document Released: 08/10/2005 Document Revised: 08/15/2013 Document Reviewed: 05/22/2013 °ExitCare® Patient Information ©2015 [employer]. This information is not intended to replace advice given to you by your health care   provider. Make sure you discuss any questions you have with your health care provider. ° °

## 2014-02-11 NOTE — MAU Provider Note (Signed)
History   23 yo G3P1011 at 7935 4/7 weeks presented after calling with c/o persistent contractions since 9pm--denies leaking or bleeding, reports +FM.  Now notes UCs have calmed down since arrival.  Patient Active Problem List   Diagnosis Date Noted  . Obesity, unspecified 02/11/2014  . PCOS (polycystic ovarian syndrome) 01/28/2012    Chief Complaint  Patient presents with  . Labor Eval   HPI:  As above  OB History   Grav Para Term Preterm Abortions TAB SAB Ect Mult Living   3 1 1  1  1   1       Past Medical History  Diagnosis Date  . No pertinent past medical history   . PCOS (polycystic ovarian syndrome)   . Miscarriage     Past Surgical History  Procedure Laterality Date  . Dilation and evacuation  06/18/2011    Procedure: DILATATION AND EVACUATION (D&E);  Surgeon: Hal MoralesVanessa P Haygood, MD;  Location: [facility];  Service: Gynecology;  Laterality: N/A;    Family History  Problem Relation Age of Onset  . Arthritis Mother   . Diabetes Maternal Aunt   . Diabetes Maternal Grandmother   . Cancer Maternal Grandmother     BREAST; POST MENOPAUSAL  . Hyperlipidemia Maternal Grandmother   . Hypertension Maternal Grandmother   . Anesthesia problems Neg Hx   . Sickle cell trait Father   . Sickle cell trait Sister   . Diabetes Maternal Uncle   . Kidney disease Maternal Uncle     DIALYSIS  . Alcohol abuse Maternal Uncle   . Drug abuse Maternal Uncle     History  Substance Use Topics  . Smoking status: Never Smoker   . Smokeless tobacco: Never Used  . Alcohol Use: No    Allergies: No Known Allergies  Prescriptions prior to admission  Medication Sig Dispense Refill  . Prenatal Vit-Fe Fumarate-FA (PRENATAL MULTIVITAMIN) TABS Take 1 tablet by mouth daily.  90 tablet  3    ROS:  Cramping overnight, +FM Physical Exam   Blood pressure 116/69, pulse 107, temperature 98.9 F (37.2 C), temperature source Oral, resp. rate 16, height 5\' 5"  (1.651 m), weight 202 lb (91.627  kg).  Physical Exam Chest clear Heart RRR without murmur Abd gravid, NT Pelvic--cervix posterior, FT, 50%, firm, vtx, -2 Ext WNL  FHR--initially Category 2 in initial 30 min tracing --baseline 150, decreased variability, no decels.   Now Category 1, will accels noted. UCs very occasional, mild  ED Course  Assessment: IUP at 35 4/7 weeks Uterine activity, no evidence labor   Plan: Monitor FHR at present for continued reassuring status GBS done today.    Nigel BridgemanLATHAM, VICKI CNM, MSN 02/11/2014 9:35 AM  Addendum: FHR Category 1, lots of FM Very occasional UCs, mild.  D/C'd home with labor precautions. Keep scheduled appt at [facility] next week, or call prn.  Nigel BridgemanVicki Latham, CNM 02/11/14 10:25a

## 2014-02-13 LAB — CULTURE, BETA STREP (GROUP B ONLY)

## 2014-02-15 LAB — OB RESULTS CONSOLE GC/CHLAMYDIA
CHLAMYDIA, DNA PROBE: NEGATIVE
GC PROBE AMP, GENITAL: NEGATIVE

## 2014-02-26 ENCOUNTER — Encounter (HOSPITAL_COMMUNITY): Payer: Self-pay | Admitting: *Deleted

## 2014-02-26 ENCOUNTER — Inpatient Hospital Stay (HOSPITAL_COMMUNITY)
Admission: AD | Admit: 2014-02-26 | Discharge: 2014-02-26 | Disposition: A | Discharge status: Home / Self Care | Payer: Medicaid Other | Source: Ambulatory Visit | Attending: Obstetrics and Gynecology | Admitting: Obstetrics and Gynecology

## 2014-02-26 DIAGNOSIS — O99210 Obesity complicating pregnancy, unspecified trimester: Secondary | ICD-10-CM | POA: Diagnosis not present

## 2014-02-26 DIAGNOSIS — O479 False labor, unspecified: Secondary | ICD-10-CM | POA: Diagnosis present

## 2014-02-26 DIAGNOSIS — E669 Obesity, unspecified: Secondary | ICD-10-CM | POA: Diagnosis not present

## 2014-02-26 NOTE — Discharge Instructions (Signed)
Braxton Hicks Contractions °Contractions of the uterus can occur throughout pregnancy. Contractions are not always a sign that you are in labor.  °WHAT ARE BRAXTON HICKS CONTRACTIONS?  °Contractions that occur before labor are called Braxton Hicks contractions, or false labor. Toward the end of pregnancy (32-34 weeks), these contractions can develop more often and may become more forceful. This is not true labor because these contractions do not result in opening (dilatation) and thinning of the cervix. They are sometimes difficult to tell apart from true labor because these contractions can be forceful and people have different pain tolerances. You should not feel embarrassed if you go to the hospital with false labor. Sometimes, the only way to tell if you are in true labor is for your health care provider to look for changes in the cervix. °If there are no prenatal problems or other health problems associated with the pregnancy, it is completely safe to be sent home with false labor and await the onset of true labor. °HOW CAN YOU TELL THE DIFFERENCE BETWEEN TRUE AND FALSE LABOR? °False Labor °· The contractions of false labor are usually shorter and not as hard as those of true labor.   °· The contractions are usually irregular.   °· The contractions are often felt in the front of the lower abdomen and in the groin.   °· The contractions may go away when you walk around or change positions while lying down.   °· The contractions get weaker and are shorter lasting as time goes on.   °· The contractions do not usually become progressively stronger, regular, and closer together as with true labor.   °True Labor °· Contractions in true labor last 30-70 seconds, become very regular, usually become more intense, and increase in frequency.   °· The contractions do not go away with walking.   °· The discomfort is usually felt in the top of the uterus and spreads to the lower abdomen and low back.   °· True labor can be  determined by your health care provider with an exam. This will show that the cervix is dilating and getting thinner.   °WHAT TO REMEMBER °· Keep up with your usual exercises and follow other instructions given by your health care provider.   °· Take medicines as directed by your health care provider.   °· Keep your regular prenatal appointments.   °· Eat and drink lightly if you think you are going into labor.   °· If Braxton Hicks contractions are making you uncomfortable:   °¨ Change your position from lying down or resting to walking, or from walking to resting.   °¨ Sit and rest in a tub of warm water.   °¨ Drink 2-3 glasses of water. Dehydration may cause these contractions.   °¨ Do slow and deep breathing several times an hour.   °WHEN SHOULD I SEEK IMMEDIATE MEDICAL CARE? °Seek immediate medical care if: °· Your contractions become stronger, more regular, and closer together.   °· You have fluid leaking or gushing from your vagina.   °· You have a fever.   °· You pass blood-tinged mucus.   °· You have vaginal bleeding.   °· You have continuous abdominal pain.   °· You have low back pain that you never had before.   °· You feel your baby's head pushing down and causing pelvic pressure.   °· Your baby is not moving as much as it used to.   °Document Released: 08/10/2005 Document Revised: 08/15/2013 Document Reviewed: 05/22/2013 °ExitCare® Patient Information ©2015 [employer]. This information is not intended to replace advice given to you by your health care   provider. Make sure you discuss any questions you have with your health care provider. ° °

## 2014-02-26 NOTE — MAU Provider Note (Signed)
History    Patient is a 23y.o. G3P1011 at 37.5wks who presents, unannounced, for contractions. States contractions started around 11 pm and have been approximately 7 minutes apart.  Patient states most have been occurring in her back.  Patient last evaluated in the office on 02/19/2014 by Dr. Lance MorinA. Roberts and was 1/50/-2.  Patient denies LOF, VB, and reports active fetus.   Patient Active Problem List   Diagnosis Date Noted  . Obesity, unspecified 02/11/2014  . PCOS (polycystic ovarian syndrome) 01/28/2012    Chief Complaint  Patient presents with  . Labor Eval   HPI  OB History   Grav Para Term Preterm Abortions TAB SAB Ect Mult Living   3 1 1  1  1   1       Past Medical History  Diagnosis Date  . No pertinent past medical history   . PCOS (polycystic ovarian syndrome)   . Miscarriage     Past Surgical History  Procedure Laterality Date  . Dilation and evacuation  06/18/2011    Procedure: DILATATION AND EVACUATION (D&E);  Surgeon: Hal MoralesVanessa P Haygood, MD;  Location: [facility];  Service: Gynecology;  Laterality: N/A;    Family History  Problem Relation Age of Onset  . Arthritis Mother   . Diabetes Maternal Aunt   . Diabetes Maternal Grandmother   . Cancer Maternal Grandmother     BREAST; POST MENOPAUSAL  . Hyperlipidemia Maternal Grandmother   . Hypertension Maternal Grandmother   . Anesthesia problems Neg Hx   . Sickle cell trait Father   . Sickle cell trait Sister   . Diabetes Maternal Uncle   . Kidney disease Maternal Uncle     DIALYSIS  . Alcohol abuse Maternal Uncle   . Drug abuse Maternal Uncle     History  Substance Use Topics  . Smoking status: Never Smoker   . Smokeless tobacco: Never Used  . Alcohol Use: No    Allergies: No Known Allergies  Prescriptions prior to admission  Medication Sig Dispense Refill  . Prenatal Vit-Fe Fumarate-FA (PRENATAL MULTIVITAMIN) TABS Take 1 tablet by mouth daily.  90 tablet  3    ROS  See HPI Above Physical Exam    Blood pressure 125/71, pulse 95, temperature 98.8 F (37.1 C), temperature source Oral, resp. rate 18, height 5\' 6"  (1.676 m), weight 206 lb (93.441 kg).  Physical Exam SVE: 1/50/-2, posterior FHR: 145 bpm, Mod Var, -Decels, +Accels UC: None graphed or palpated ED Course  Assessment: IUP at 37.5wks Contractions GBS Negative  Plan: -Await reactive NST -May ambulate in halls if desired -Will reassess in 1.5 to 2 hours   -NST reactive at 0221  Follow Up (0255) -Patient no longer wishes to ambulate and requests discharge, declines cervical exam and pain medication -Keep appt in at10am -Call if you have any questions or concerns prior to your next visit.   Elianie Hubers LYNN CNM, MSN 02/26/2014 1:39 AM

## 2014-02-26 NOTE — MAU Note (Signed)
Reports contractions every 7 minutes since 11 pm. Denies vaginal bleeding and leaking of fluid. Good FM

## 2014-03-13 ENCOUNTER — Inpatient Hospital Stay (HOSPITAL_COMMUNITY)
Admission: AD | Admit: 2014-03-13 | Discharge: 2014-03-16 | DRG: 775 | Disposition: A | Discharge status: Home / Self Care | Payer: Medicaid Other | Source: Ambulatory Visit | Attending: Obstetrics & Gynecology | Admitting: Obstetrics & Gynecology

## 2014-03-13 ENCOUNTER — Encounter (HOSPITAL_COMMUNITY): Payer: Self-pay

## 2014-03-13 DIAGNOSIS — E669 Obesity, unspecified: Secondary | ICD-10-CM | POA: Diagnosis present

## 2014-03-13 DIAGNOSIS — Z833 Family history of diabetes mellitus: Secondary | ICD-10-CM | POA: Diagnosis not present

## 2014-03-13 DIAGNOSIS — Z8249 Family history of ischemic heart disease and other diseases of the circulatory system: Secondary | ICD-10-CM | POA: Diagnosis not present

## 2014-03-13 DIAGNOSIS — O4290 Premature rupture of membranes, unspecified as to length of time between rupture and onset of labor, unspecified weeks of gestation: Secondary | ICD-10-CM | POA: Diagnosis present

## 2014-03-13 DIAGNOSIS — O99214 Obesity complicating childbirth: Secondary | ICD-10-CM

## 2014-03-13 DIAGNOSIS — O479 False labor, unspecified: Secondary | ICD-10-CM | POA: Diagnosis present

## 2014-03-13 LAB — CBC
HCT: 32.3 % — ABNORMAL LOW (ref 36.0–46.0)
HEMOGLOBIN: 11.1 g/dL — AB (ref 12.0–15.0)
MCH: 31.1 pg (ref 26.0–34.0)
MCHC: 34.4 g/dL (ref 30.0–36.0)
MCV: 90.5 fL (ref 78.0–100.0)
Platelets: 173 10*3/uL (ref 150–400)
RBC: 3.57 MIL/uL — ABNORMAL LOW (ref 3.87–5.11)
RDW: 13.5 % (ref 11.5–15.5)
WBC: 7.3 10*3/uL (ref 4.0–10.5)

## 2014-03-13 LAB — WET PREP, GENITAL
Clue Cells Wet Prep HPF POC: NONE SEEN
TRICH WET PREP: NONE SEEN
YEAST WET PREP: NONE SEEN

## 2014-03-13 LAB — AMNISURE RUPTURE OF MEMBRANE (ROM) NOT AT ARMC: Amnisure ROM: POSITIVE

## 2014-03-13 MED ORDER — LACTATED RINGERS IV SOLN
INTRAVENOUS | Status: DC
  Administered 2014-03-14 (×3): via INTRAVENOUS

## 2014-03-13 NOTE — MAU Note (Signed)
Report called to birthing suites charge RN. Will hold pt until room available and call back if patient becomes uncomfortable.

## 2014-03-13 NOTE — MAU Note (Signed)
Pt reports ROM at 2040, denies problems with pregnancy

## 2014-03-13 NOTE — H&P (Signed)
Robin [name] is a 23 y.o. female, G3P1011 at 39.6 weeks, presenting for SROM at 2040.  Patient states that she had gush of fluid while eating and has been leaking since incident.  Patient reports active fetus, denies contractions and VB.  Patient states that she had her membranes stripped this am in the office.   Patient Active Problem List   Diagnosis Date Noted  . Obesity, unspecified 02/11/2014  . PCOS (polycystic ovarian syndrome) 01/28/2012    History of present pregnancy: Patient entered care at 10.5 weeks.   EDC of 03/15/2014 was established by Definite LMP on 06/08/2013.   Anatomy scan:  21.1 weeks, with normal, but limited findings on RVOT and a posterior placenta.   Additional US evaluations:  24.1 wks for view of RVOT-seen.   Significant prenatal events:  Decreased FM at 36.1wks   Last evaluation:  03/13/2014 by L. Montez Moritaarter, FNP--4/70/-1  OB History   Grav Para Term Preterm Abortions TAB SAB Ect Mult Living   3 1 1  1  1   1      Past Medical History  Diagnosis Date  . No pertinent past medical history   . PCOS (polycystic ovarian syndrome)   . Miscarriage    Past Surgical History  Procedure Laterality Date  . Dilation and evacuation  06/18/2011    Procedure: DILATATION AND EVACUATION (D&E);  Surgeon: Hal MoralesVanessa P Haygood, MD;  Location: [facility];  Service: Gynecology;  Laterality: N/A;   Family History: family history includes Alcohol abuse in her maternal uncle; Arthritis in her mother; Cancer in her maternal grandmother; Diabetes in her maternal aunt, maternal grandmother, and maternal uncle; Drug abuse in her maternal uncle; Hyperlipidemia in her maternal grandmother; Hypertension in her maternal grandmother; Kidney disease in her maternal uncle; Sickle cell trait in her father and sister. There is no history of Anesthesia problems. Social History:  reports that she has never smoked. She has never used smokeless tobacco. She reports that she does not drink alcohol or use  illicit drugs.   Prenatal Transfer Tool  Maternal Diabetes: No Genetic Screening: Normal Maternal Ultrasounds/Referrals: Normal Fetal Ultrasounds or other Referrals:  None Maternal Substance Abuse:  No Significant Maternal Medications:  None Significant Maternal Lab Results: Lab values include: Group B Strep negative    ROS:  See HPI Above  No Known Allergies     Blood pressure 124/81, pulse 98, temperature 99.1 F (37.3 C), temperature source Oral, resp. rate 18, height 5\' 6"  (1.676 m), weight 208 lb (94.348 kg), SpO2 99.00%.  Chest clear Heart RRR without murmur Abd gravid, NT Pelvic: 4/70/-1 Ext: WNL  FHR: 140s bpm, Mod Var, -Decels, +Accels UCs:  None palpated or graphed  Prenatal labs: ABO, Rh:  O Positive Antibody:  Negative Rubella:   Immune RPR:   Negative HBsAg:   Negative HIV:   Negative GBS:  Negative Sickle cell/Hgb electrophoresis:  Normal Pap:  Normal GC:  Negative Chlamydia:  Negative Genetic screenings:  Normal Glucola:  Normal Other:  TSH: WNL, HA1C-Elevated at 6.7    Assessment IUP at 39.6wks Cat I FT SROM GBS Negative  [facility] consult with Dr. Katharine LookJ. Ozan Routine Labor and Delivery Orders Okay for Epidural, if desired Okay for intermittent monitoring Discussed waiting 4 hours s/p rupture to start augmentation if ctx does not start and/or no cervical change. Patient verbalized understanding.  Phillips ClimesMLY, Ziaire Hagos LYNNCNM, MSN 03/13/2014, 9:51 PM

## 2014-03-13 NOTE — MAU Note (Signed)
Removed from monitor per CNM

## 2014-03-14 ENCOUNTER — Inpatient Hospital Stay (HOSPITAL_COMMUNITY): Payer: Medicaid Other | Admitting: Anesthesiology

## 2014-03-14 ENCOUNTER — Encounter (HOSPITAL_COMMUNITY): Payer: Medicaid Other | Admitting: Anesthesiology

## 2014-03-14 ENCOUNTER — Encounter (HOSPITAL_COMMUNITY): Payer: Self-pay | Admitting: Obstetrics

## 2014-03-14 DIAGNOSIS — O4290 Premature rupture of membranes, unspecified as to length of time between rupture and onset of labor, unspecified weeks of gestation: Secondary | ICD-10-CM | POA: Diagnosis present

## 2014-03-14 LAB — RPR

## 2014-03-14 MED ORDER — IBUPROFEN 600 MG PO TABS: 600.0000 mg | ORAL_TABLET | Freq: Four times a day (QID) | ORAL | Status: DC | PRN

## 2014-03-14 MED ORDER — EPHEDRINE 5 MG/ML INJ
10.0000 mg | INTRAVENOUS | Status: DC | PRN
  Filled 2014-03-14: qty 2

## 2014-03-14 MED ORDER — WITCH HAZEL-GLYCERIN EX PADS: 1.0000 "application " | MEDICATED_PAD | CUTANEOUS | Status: DC | PRN

## 2014-03-14 MED ORDER — FENTANYL 2.5 MCG/ML BUPIVACAINE 1/10 % EPIDURAL INFUSION (WH - ANES): 14.0000 mL/h | INTRAMUSCULAR | Status: DC | PRN

## 2014-03-14 MED ORDER — LIDOCAINE HCL (PF) 1 % IJ SOLN
30.0000 mL | INTRAMUSCULAR | Status: DC | PRN
  Filled 2014-03-14: qty 30

## 2014-03-14 MED ORDER — ONDANSETRON HCL 4 MG/2ML IJ SOLN: 4.0000 mg | INTRAMUSCULAR | Status: DC | PRN

## 2014-03-14 MED ORDER — PRENATAL MULTIVITAMIN CH: 1.0000 | ORAL_TABLET | Freq: Every day | ORAL | Status: DC

## 2014-03-14 MED ORDER — DIPHENHYDRAMINE HCL 50 MG/ML IJ SOLN: 12.5000 mg | INTRAMUSCULAR | Status: DC | PRN

## 2014-03-14 MED ORDER — ONDANSETRON HCL 4 MG PO TABS: 4.0000 mg | ORAL_TABLET | ORAL | Status: DC | PRN

## 2014-03-14 MED ORDER — DIPHENHYDRAMINE HCL 25 MG PO CAPS: 25.0000 mg | ORAL_CAPSULE | Freq: Four times a day (QID) | ORAL | Status: DC | PRN

## 2014-03-14 MED ORDER — PHENYLEPHRINE 40 MCG/ML (10ML) SYRINGE FOR IV PUSH (FOR BLOOD PRESSURE SUPPORT)
PREFILLED_SYRINGE | INTRAVENOUS | Status: AC
  Filled 2014-03-14: qty 10

## 2014-03-14 MED ORDER — ONDANSETRON HCL 4 MG/2ML IJ SOLN: 4.0000 mg | Freq: Four times a day (QID) | INTRAMUSCULAR | Status: DC | PRN

## 2014-03-14 MED ORDER — IBUPROFEN 600 MG PO TABS
600.0000 mg | ORAL_TABLET | Freq: Four times a day (QID) | ORAL | Status: DC
  Administered 2014-03-14 – 2014-03-16 (×7): 600 mg via ORAL
  Filled 2014-03-14 (×7): qty 1

## 2014-03-14 MED ORDER — LANOLIN HYDROUS EX OINT: TOPICAL_OINTMENT | CUTANEOUS | Status: DC | PRN

## 2014-03-14 MED ORDER — OXYCODONE-ACETAMINOPHEN 5-325 MG PO TABS
1.0000 | ORAL_TABLET | ORAL | Status: DC | PRN
  Administered 2014-03-15 – 2014-03-16 (×2): 1 via ORAL
  Filled 2014-03-14 (×2): qty 1

## 2014-03-14 MED ORDER — PHENYLEPHRINE 40 MCG/ML (10ML) SYRINGE FOR IV PUSH (FOR BLOOD PRESSURE SUPPORT)
80.0000 ug | PREFILLED_SYRINGE | INTRAVENOUS | Status: DC | PRN
  Filled 2014-03-14: qty 2

## 2014-03-14 MED ORDER — TERBUTALINE SULFATE 1 MG/ML IJ SOLN: 0.2500 mg | Freq: Once | INTRAMUSCULAR | Status: DC | PRN

## 2014-03-14 MED ORDER — OXYTOCIN 40 UNITS IN LACTATED RINGERS INFUSION - SIMPLE MED
1.0000 m[IU]/min | INTRAVENOUS | Status: DC
  Administered 2014-03-14: 1 m[IU]/min via INTRAVENOUS
  Filled 2014-03-14: qty 1000

## 2014-03-14 MED ORDER — LIDOCAINE HCL (PF) 1 % IJ SOLN
INTRAMUSCULAR | Status: DC | PRN
  Administered 2014-03-14: 10 mL

## 2014-03-14 MED ORDER — LACTATED RINGERS IV SOLN
500.0000 mL | Freq: Once | INTRAVENOUS | Status: AC
  Administered 2014-03-14: 500 mL via INTRAVENOUS

## 2014-03-14 MED ORDER — ZOLPIDEM TARTRATE 5 MG PO TABS: 5.0000 mg | ORAL_TABLET | Freq: Every evening | ORAL | Status: DC | PRN

## 2014-03-14 MED ORDER — ACETAMINOPHEN 325 MG PO TABS: 650.0000 mg | ORAL_TABLET | ORAL | Status: DC | PRN

## 2014-03-14 MED ORDER — FENTANYL 2.5 MCG/ML BUPIVACAINE 1/10 % EPIDURAL INFUSION (WH - ANES)
INTRAMUSCULAR | Status: AC
  Administered 2014-03-14: 14 mL/h via EPIDURAL
  Filled 2014-03-14: qty 125

## 2014-03-14 MED ORDER — BENZOCAINE-MENTHOL 20-0.5 % EX AERO
1.0000 | INHALATION_SPRAY | CUTANEOUS | Status: DC | PRN
Start: 2014-03-14 — End: 2014-03-16
  Administered 2014-03-14: 1 via TOPICAL
  Filled 2014-03-14: qty 56

## 2014-03-14 MED ORDER — OXYTOCIN 40 UNITS IN LACTATED RINGERS INFUSION - SIMPLE MED: 62.5000 mL/h | INTRAVENOUS | Status: DC

## 2014-03-14 MED ORDER — OXYCODONE-ACETAMINOPHEN 5-325 MG PO TABS: 1.0000 | ORAL_TABLET | ORAL | Status: DC | PRN

## 2014-03-14 MED ORDER — OXYTOCIN BOLUS FROM INFUSION: 500.0000 mL | INTRAVENOUS | Status: DC

## 2014-03-14 MED ORDER — CITRIC ACID-SODIUM CITRATE 334-500 MG/5ML PO SOLN: 30.0000 mL | ORAL | Status: DC | PRN

## 2014-03-14 MED ORDER — SIMETHICONE 80 MG PO CHEW: 80.0000 mg | CHEWABLE_TABLET | ORAL | Status: DC | PRN

## 2014-03-14 MED ORDER — TETANUS-DIPHTH-ACELL PERTUSSIS 5-2.5-18.5 LF-MCG/0.5 IM SUSP: 0.5000 mL | Freq: Once | INTRAMUSCULAR | Status: DC

## 2014-03-14 MED ORDER — NALBUPHINE HCL 10 MG/ML IJ SOLN: 10.0000 mg | INTRAMUSCULAR | Status: DC | PRN

## 2014-03-14 MED ORDER — LACTATED RINGERS IV SOLN: 500.0000 mL | INTRAVENOUS | Status: DC | PRN

## 2014-03-14 MED ORDER — SENNOSIDES-DOCUSATE SODIUM 8.6-50 MG PO TABS
2.0000 | ORAL_TABLET | ORAL | Status: DC
  Administered 2014-03-14 – 2014-03-15 (×2): 2 via ORAL
  Filled 2014-03-14 (×2): qty 2

## 2014-03-14 MED ORDER — DIBUCAINE 1 % RE OINT: 1.0000 "application " | TOPICAL_OINTMENT | RECTAL | Status: DC | PRN

## 2014-03-14 NOTE — Progress Notes (Signed)
Subjective: Feeling a lot of pressure s/p epidural. Support person at bedside.  Objective: BP 108/58  Pulse 110  Temp(Src) 98.2 F (36.8 C) (Oral)  Resp 20  Ht 5\' 6"  (1.676 m)  Wt 208 lb (94.348 kg)  BMI 33.59 kg/m2  SpO2 98%     FHT: BL FHR 135 w/ mod variability, +accels, earlys, occ variable, no lates UC:   irregular, every 1-2 minutes SVE:   Dilation: 7 Effacement (%): 90 Station: +1 Exam by:: Shandel Busic, CNM Pitocin at 11 mius/min MVUs <120   Assessment:  Inadequate MVUs but appropriate cervical change Active phase of labor NICHD, Cat 2  Plan: Anticipate progress and SVD Consult prn  [name], Jourdyn Ferrin CNM 03/14/2014, 12:49 PM

## 2014-03-14 NOTE — Progress Notes (Signed)
  Subjective: Comfortable until amniotomy, then began feeling ctxs. Support person at bedside.  Objective: BP 118/70  Pulse 91  Temp(Src) 98.4 F (36.9 C) (Oral)  Resp 18  Ht 5\' 6"  (1.676 m)  Wt 208 lb (94.348 kg)  BMI 33.59 kg/m2  SpO2 99%      FHT: BL 140 w/ min-mod variability, +accels (10x10), no decels UC:   irregular, every 2-3 minutes --- unable to trace prior to IUPC placement SVE:   4/80/-2 Pitocin at 11 mius/min AROM'd, mod amount of clear fluid IUPC placed  Assessment:  Early labor; min cervical change NICHD, Cat 2 FHRT  Plan: Continue w/ current plan IV pain meds and/or Epidural as desired Consult as indicated Expect progress and SVD  [name], Robin [name] CNM 03/14/2014, 11:03 AM

## 2014-03-14 NOTE — Progress Notes (Signed)
  Subjective: -Patient reports no regular contractions.  Remains in MAU, husband supportive at bedside.   Objective: BP 124/81  Pulse 98  Temp(Src) 99.1 F (37.3 C) (Oral)  Resp 18  Ht 5\' 6"  (1.676 m)  Wt 208 lb (94.348 kg)  BMI 33.59 kg/m2  SpO2 99%     FHT: 135 bpm, Mod Var, -Decels, +Accels UC:   None graphed or palpated SVE:   Dilation: 4 Effacement (%): 80 Station: -2 Exam by:: Sabas SousJ. Srihari Shellhammer Membranes: SROM at 2030 Pitocin: None  Assessment:  IUP at 40wks Cat I FT  GBS Negative PROM Irregular Contractions  Plan: -Will await for room on BS and plan for augmentation -Continue present mgmt as ordered   Emeri Estill LYNN,CNM, MSN 03/14/2014, 1:20 AM

## 2014-03-14 NOTE — Progress Notes (Signed)
  [facility]  Requesting cervical exam and initiation of pitocin.   Objective: BP 112/58  Pulse 82  Temp(Src) 98.6 F (37 C) (Oral)  Resp 20  Ht 5\' 6"  (1.676 m)  Wt 208 lb (94.348 kg)  BMI 33.59 kg/m2  SpO2 99%     FHT: 135 bpm, Mod Var, -Decels, +Accels UC:   None SVE:   Dilation: 4 Effacement (%): 80 Station: -2 Exam by:: J Clebert Wenger CNM Membranes: SROM at 2030 Pitocin: Will initate  Assessment:  IUP at 40wks Cat I FT  PROM Pitocin Augmentation  Plan: -Will initiate pitocin at 441mUn/min -Implement continuous fetal monitoring -Continue other mgmt as ordered  Michaelah Credeur LYNN,CNM, MSN 03/14/2014, 3:42 AM

## 2014-03-14 NOTE — Progress Notes (Addendum)
Subjective: Received report and assumed care at 0700. Pt sitting upright, breathing through ctxs. Desires NCB.   Objective: BP 124/76  Pulse 93  Temp(Src) 98 F (36.7 C) (Oral)  Resp 18  Ht 5\' 6"  (1.676 m)  Wt 208 lb (94.348 kg)  BMI 33.59 kg/m2  SpO2 99%      FHT: BL 140 w/ min variability, occ accels, earlys, no lates, no variables UC:   irregular, every 2-3 minutes, palpate mild SVE:   Dilation: 4 Effacement (%): 80 Station: -3 Exam by:: Oluwadarasimi Redmon, CNM Cephalic by Leopolds BBOW Pitocin at 6 mius/min  Assessment:  23 yo G3P1011 @ 40.0 wks admitted for SROM, confirmed by amnisure Palpable forebag Cat 2 FHRT Early labor GBS neg  Plan: Continue current plan Re-evaluate in 2 hrs, sooner if indicated for AROM and IUPC placement - too high for amniotomy at this time Consult prn Expect progress and SVD  [name], Vallie Teters CNM 03/14/2014, 8:42 AM

## 2014-03-14 NOTE — Anesthesia Procedure Notes (Signed)

## 2014-03-14 NOTE — Anesthesia Preprocedure Evaluation (Signed)

## 2014-03-15 LAB — CBC
HCT: 28 % — ABNORMAL LOW (ref 36.0–46.0)
Hemoglobin: 9.5 g/dL — ABNORMAL LOW (ref 12.0–15.0)
MCH: 30.8 pg (ref 26.0–34.0)
MCHC: 33.9 g/dL (ref 30.0–36.0)
MCV: 90.9 fL (ref 78.0–100.0)
PLATELETS: 165 10*3/uL (ref 150–400)
RBC: 3.08 MIL/uL — AB (ref 3.87–5.11)
RDW: 13.7 % (ref 11.5–15.5)
WBC: 8.5 10*3/uL (ref 4.0–10.5)

## 2014-03-15 MED ORDER — OXYCODONE-ACETAMINOPHEN 5-325 MG PO TABS: 1.0000 | ORAL_TABLET | ORAL | Status: DC | PRN

## 2014-03-15 MED ORDER — FERROUS SULFATE 325 (65 FE) MG PO TABS: 325.0000 mg | ORAL_TABLET | Freq: Two times a day (BID) | ORAL | Status: DC

## 2014-03-15 MED ORDER — IBUPROFEN 600 MG PO TABS: 600.0000 mg | ORAL_TABLET | Freq: Four times a day (QID) | ORAL | Status: DC

## 2014-03-15 NOTE — Lactation Note (Signed)
This note was copied from the chart of Girl CameroonBrittany Bicking. Lactation Consultation Note  Patient Name: Girl Caryn BeeBrittany Pendry Today's Date: 03/15/2014 Reason for consult: Initial assessment  Mom stated she had just fed in side-lying position in bed [facility]  Infant asleep beside mom in bed.  Safe Sleep discussed; mom adjusted infant's position to flat on bed; mom very awake and had been [facility].  Mom has Hx PCOS but stated she breastfed her first child for 10 months without any difficulty.  Chart review - 40.0 GA - infant has breastfed x6 (15-30) + 1 (5 min) in past 24 hours; voids-3; stools-5 in past 24 hours; LS-9 by RN.  Mom is very knowledgeable about breastfeeding and wants to breastfeed this child for 1+ years.  Encouraged mom to call at next feeding for latch check.  She stated the baby does not open mouth very wide but denies any pain.  Pacifier lying in bed; risks of pacifier use discussed and mom stated the infant does not like using it.  Lactation brochure given and informed of outpatient services and hospital support group.  Encouraged mom to call for assistance as needed.  Mom very pleasant and receptive to teaching/ discussions.     Maternal Data Formula Feeding for Exclusion: No Infant to breast within first hour of birth: Yes Has patient been taught Hand Expression?: Yes ([facility] Does the patient have breastfeeding experience prior to this delivery?: Yes  Feeding Feeding Type: Breast Fed Length of feed: 30 min  LATCH Score/Interventions                      Lactation Tools Discussed/Used WIC Program: Yes   Consult Status Consult Status: Follow-up Date: 03/16/14 Follow-up type: In-patient    Lendon KaVann, Devell Parkerson Walker 03/15/2014, 4:59 PM

## 2014-03-15 NOTE — Anesthesia Postprocedure Evaluation (Signed)
  Anesthesia Post-op Note  Patient: Robin [name]  Procedure(s) Performed: * No procedures listed *  Patient Location: Mother/Baby  Anesthesia Type:Epidural  Level of Consciousness: awake, alert , oriented and patient cooperative  Airway and Oxygen Therapy: Patient Spontanous Breathing  Post-op Pain: mild  Post-op Assessment: Post-op Vital signs reviewed, Patient's Cardiovascular Status Stable, Respiratory Function Stable, Patent Airway, No signs of Nausea or vomiting, Adequate PO intake, Pain level controlled, No headache, No backache and No residual numbness  Post-op Vital Signs: Reviewed and stable  Last Vitals:  Filed Vitals:   03/15/14 0530  BP: 112/76  Pulse:   Temp: 36.2 C  Resp:     Complications: No apparent anesthesia complications

## 2014-03-15 NOTE — Progress Notes (Signed)
Robin [name]  Over all doing well  Subjective: Post Partum Day 1 Vaginal delivery, no laceration Patient up ad lib, denies syncope or dizziness. Reports consuming regular diet without issues and denies N/V No issues with urination and reports bleeding is appropriate Feeding:  Breast  Contraceptive plan:   Nexplanon  Objective: Temp:  [97.2 F (36.2 C)-98.8 F (37.1 C)] 97.2 F (36.2 C) (07/23 0530) Pulse Rate:  [78-110] 83 (07/22 2129) Resp:  [18] 18 (07/22 2129) BP: (106-133)/(60-89) 112/76 mmHg (07/23 0530) SpO2:  [100 %] 100 % (07/22 1616)  Physical Exam:  General: alert and cooperative Ext:  Breast: Lungs: Abdomen:  + bowel sounds, non distended Lochia: appropriate Uterine Fundus: firm Laceration: n/a DVT Evaluation: No evidence of DVT seen on physical exam.    Recent Labs  03/13/14 2209 03/15/14 0620  HGB 11.1* 9.5*  HCT 32.3* 28.0*    Assessment S/P Vaginal Delivery-Day 1 Stable Normal Involution Breastfeeding/Bottlefeeding   Plan: Continue current care Dr.Dillard updated on patient status  Plan for discharge tomorrow Lactation support   Robin [name], CNM, MSN 03/15/2014, 2:26 PM

## 2014-03-15 NOTE — Discharge Instructions (Signed)
Vaginal Delivery °During delivery, your health care provider will help you give birth to your baby. During a vaginal delivery, you will work to push the baby out of your vagina. However, before you can push your baby out, a few things need to happen. The opening of your uterus (cervix) has to soften, thin out, and open up (dilate) all the way to 10 cm. Also, your baby has to move down from the uterus into your vagina.  °SIGNS OF LABOR  °Your health care provider will first need to make sure you are in labor. Signs of labor include:  °· Passing what is called the mucous plug before labor begins. This is a small amount of blood-stained mucus. °· Having regular, painful uterine contractions.   °· The time between contractions gets shorter.   °· The discomfort and pain gradually get more intense. °· Contraction pains get worse when walking and do not go away when resting.   °· Your cervix becomes thinner (effacement) and dilates. °BEFORE THE DELIVERY °Once you are in labor and admitted into the hospital or care center, your health care provider may do the following:  °· Perform a complete physical exam. °· Review any complications related to pregnancy or labor.  °· Check your blood pressure, pulse, temperature, and heart rate (vital signs).   °· Determine if, and when, the rupture of amniotic membranes occurred. °· Do a vaginal exam (using a sterile glove and lubricant) to determine:   °¨ The position (presentation) of the baby. Is the baby's head presenting first (vertex) in the birth canal (vagina), or are the feet or buttocks first (breech)?   °¨ The level (station) of the baby's head within the birth canal.   °¨ The effacement and dilatation of the cervix.   °· An electronic fetal monitor is usually placed on your abdomen when you first arrive. This is used to monitor your contractions and the baby's heart rate. °¨ When the monitor is on your abdomen (external fetal monitor), it can only pick up the frequency and  length of your contractions. It cannot tell the strength of your contractions. °¨ If it becomes necessary for your health care provider to know exactly how strong your contractions are or to see exactly what the baby's heart rate is doing, an internal monitor may be inserted into your vagina and uterus. Your health care provider will discuss the benefits and risks of using an internal monitor and obtain your permission before inserting the device. °¨ Continuous fetal monitoring may be needed if you have an epidural, are receiving certain medicines (such as oxytocin), or have pregnancy or labor complications. °· An IV access tube may be placed into a vein in your arm to deliver fluids and medicines if necessary. °THREE STAGES OF LABOR AND DELIVERY °Normal labor and delivery is divided into three stages. °First Stage °This stage starts when you begin to contract regularly and your cervix begins to efface and dilate. It ends when your cervix is completely open (fully dilated). The first stage is the longest stage of labor and can last from 3 hours to 15 hours.  °Several methods are available to help with labor pain. You and your health care provider will decide which option is best for you. Options include:  °· Opioid medicines. These are strong pain medicines that you can get through your IV tube or as a shot into your muscle. These medicines lessen pain but do not make it go away completely.  °· Epidural. A medicine is given through a thin tube that   is inserted in your back. The medicine numbs the lower part of your body and prevents any pain in that area.  Paracervical pain medicine. This is an injection of an anesthetic on each side of your cervix.   You may request natural childbirth, which does not involve the use of pain medicines or an epidural during labor and delivery. Instead, you will use other things, such as breathing exercises, to help cope with the pain. Second Stage The second stage of labor  begins when your cervix is fully dilated at 10 cm. It continues until you push your baby down through the birth canal and the baby is born. This stage can take only minutes or several hours.  The location of your baby's head as it moves through the birth canal is reported as a number called a station. If the baby's head has not started its descent, the station is described as being at minus 3 (-3). When your baby's head is at the zero station, it is at the middle of the birth canal and is engaged in the pelvis. The station of your baby helps indicate the progress of the second stage of labor.  When your baby is born, your health care provider may hold the baby with his or her head lowered to prevent amniotic fluid, mucus, and blood from getting into the baby's lungs. The baby's mouth and nose may be suctioned with a small bulb syringe to remove any additional fluid.  Your health care provider may then place the baby on your stomach. It is important to keep the baby from getting cold. To do this, the health care provider will dry the baby off, place the baby directly on your skin (with no blankets between you and the baby), and cover the baby with warm, dry blankets.   The umbilical cord is cut. Third Stage During the third stage of labor, your health care provider will deliver the placenta (afterbirth) and make sure your bleeding is under control. The delivery of the placenta usually takes about 5 minutes but can take up to 30 minutes. After the placenta is delivered, a medicine may be given either by IV or injection to help contract the uterus and control bleeding. If you are planning to breastfeed, you can try to do so now. After you deliver the placenta, your uterus should contract and get very firm. If your uterus does not remain firm, your health care provider will massage it. This is important because the contraction of the uterus helps cut off bleeding at the site where the placenta was attached  to your uterus. If your uterus does not contract properly and stay firm, you may continue to bleed heavily. If there is a lot of bleeding, medicines may be given to contract the uterus and stop the bleeding.  Document Released: 05/19/2008 Document Revised: 12/25/2013 Document Reviewed: 01/29/2013 [facility] Patient Information 2015 [place], [place]. This information is not intended to replace advice given to you by your health care provider. Make sure you discuss any questions you have with your health care provider.  Vaginal Delivery, Care After Refer to this sheet in the next few weeks. These discharge instructions provide you with information on caring for yourself after delivery. Your caregiver may also give you specific instructions. Your treatment has been planned according to the most current medical practices available, but problems sometimes occur. Call your caregiver if you have any problems or questions after you go home. HOME CARE INSTRUCTIONS  Take over-the-counter or  prescription medicines only as directed by your caregiver or pharmacist.  Do not drink alcohol, especially if you are breastfeeding or taking medicine to relieve pain.  Do not chew or smoke tobacco.  Do not use illegal drugs.  Continue to use good perineal care. Good perineal care includes:  Wiping your perineum from front to back.  Keeping your perineum clean.  Do not use tampons or douche until your caregiver says it is okay.  Shower, wash your hair, and take tub baths as directed by your caregiver.  Wear a well-fitting bra that provides breast support.  Eat healthy foods.  Drink enough fluids to keep your urine clear or pale yellow.  Eat high-fiber foods such as whole grain cereals and breads, brown rice, beans, and fresh fruits and vegetables every day. These foods may help prevent or relieve constipation.  Follow your caregiver's recommendations regarding resumption of activities such as climbing stairs,  driving, lifting, exercising, or traveling.  Talk to your caregiver about resuming sexual activities. Resumption of sexual activities is dependent upon your risk of infection, your rate of healing, and your comfort and desire to resume sexual activity.  Try to have someone help you with your household activities and your newborn for at least a few days after you leave the hospital.  Rest as much as possible. Try to rest or take a nap when your newborn is sleeping.  Increase your activities gradually.  Keep all of your scheduled postpartum appointments. It is very important to keep your scheduled follow-up appointments. At these appointments, your caregiver will be checking to make sure that you are healing physically and emotionally. SEEK MEDICAL CARE IF:   You are passing large clots from your vagina. Save any clots to show your caregiver.  You have a foul smelling discharge from your vagina.  You have trouble urinating.  You are urinating frequently.  You have pain when you urinate.  You have a change in your bowel movements.  You have increasing redness, pain, or swelling near your vaginal incision (episiotomy) or vaginal tear.  You have pus draining from your episiotomy or vaginal tear.  Your episiotomy or vaginal tear is separating.  You have painful, hard, or reddened breasts.  You have a severe headache.  You have blurred vision or see spots.  You feel sad or depressed.  You have thoughts of hurting yourself or your newborn.  You have questions about your care, the care of your newborn, or medicines.  You are dizzy or light-headed.  You have a rash.  You have nausea or vomiting.  You were breastfeeding and have not had a menstrual period within 12 weeks after you stopped breastfeeding.  You are not breastfeeding and have not had a menstrual period by the 12th week after delivery.  You have a fever. SEEK IMMEDIATE MEDICAL CARE IF:   You have persistent  pain.  You have chest pain.  You have shortness of breath.  You faint.  You have leg pain.  You have stomach pain.  Your vaginal bleeding saturates two or more sanitary pads in 1 hour. MAKE SURE YOU:   Understand these instructions.  Will watch your condition.  Will get help right away if you are not doing well or get worse. Document Released: 08/07/2000 Document Revised: 12/25/2013 Document Reviewed: 04/06/2012 [facility] Patient Information 2015 [place], [place]. This information is not intended to replace advice given to you by your health care provider. Make sure you discuss any questions you have with  your health care provider. Breastfeeding Deciding to breastfeed is one of the best choices you can make for you and your baby. A change in hormones during pregnancy causes your breast tissue to grow and increases the number and size of your milk ducts. These hormones also allow proteins, sugars, and fats from your blood supply to make breast milk in your milk-producing glands. Hormones prevent breast milk from being released before your baby is born as well as prompt milk flow after birth. Once breastfeeding has begun, thoughts of your baby, as well as his or her sucking or crying, can stimulate the release of milk from your milk-producing glands.  BENEFITS OF BREASTFEEDING For Your Baby  Your first milk (colostrum) helps your baby's digestive system function better.   There are antibodies in your milk that help your baby fight off infections.   Your baby has a lower incidence of asthma, allergies, and sudden infant death syndrome.   The nutrients in breast milk are better for your baby than infant formulas and are designed uniquely for your baby's needs.   Breast milk improves your baby's brain development.   Your baby is less likely to develop other conditions, such as childhood obesity, asthma, or type 2 diabetes mellitus.  For You   Breastfeeding helps to create a  very special bond between you and your baby.   Breastfeeding is convenient. Breast milk is always available at the correct temperature and costs nothing.   Breastfeeding helps to burn calories and helps you lose the weight gained during pregnancy.   Breastfeeding makes your uterus contract to its prepregnancy size faster and slows bleeding (lochia) after you give birth.   Breastfeeding helps to lower your risk of developing type 2 diabetes mellitus, osteoporosis, and breast or ovarian cancer later in life. SIGNS THAT YOUR BABY IS HUNGRY Early Signs of Hunger  Increased alertness or activity.  Stretching.  Movement of the head from side to side.  Movement of the head and opening of the mouth when the corner of the mouth or cheek is stroked (rooting).  Increased sucking sounds, smacking lips, cooing, sighing, or squeaking.  Hand-to-mouth movements.  Increased sucking of fingers or hands. Late Signs of Hunger  Fussing.  Intermittent crying. Extreme Signs of Hunger Signs of extreme hunger will require calming and consoling before your baby will be able to breastfeed successfully. Do not wait for the following signs of extreme hunger to occur before you initiate breastfeeding:   Restlessness.  A loud, strong cry.   Screaming. BREASTFEEDING BASICS Breastfeeding Initiation  Find a comfortable place to sit or lie down, with your neck and back well supported.  Place a pillow or rolled up blanket under your baby to bring him or her to the level of your breast (if you are seated). Nursing pillows are specially designed to help support your arms and your baby while you breastfeed.  Make sure that your baby's abdomen is facing your abdomen.   Gently massage your breast. With your fingertips, massage from your chest wall toward your nipple in a circular motion. This encourages milk flow. You may need to continue this action during the feeding if your milk flows  slowly.  Support your breast with 4 fingers underneath and your thumb above your nipple. Make sure your fingers are well away from your nipple and your baby's mouth.   Stroke your baby's lips gently with your finger or nipple.   When your baby's mouth is open wide enough, quickly  bring your baby to your breast, placing your entire nipple and as much of the colored area around your nipple (areola) as possible into your baby's mouth.   More areola should be visible above your baby's upper lip than below the lower lip.   Your baby's tongue should be between his or her lower gum and your breast.   Ensure that your baby's mouth is correctly positioned around your nipple (latched). Your baby's lips should create a seal on your breast and be turned out (everted).  It is common for your baby to suck about 2-3 minutes in order to start the flow of breast milk. Latching Teaching your baby how to latch on to your breast properly is very important. An improper latch can cause nipple pain and decreased milk supply for you and poor weight gain in your baby. Also, if your baby is not latched onto your nipple properly, he or she may swallow some air during feeding. This can make your baby fussy. Burping your baby when you switch breasts during the feeding can help to get rid of the air. However, teaching your baby to latch on properly is still the best way to prevent fussiness from swallowing air while breastfeeding. Signs that your baby has successfully latched on to your nipple:    Silent tugging or silent sucking, without causing you pain.   Swallowing heard between every 3-4 sucks.    Muscle movement above and in front of his or her ears while sucking.  Signs that your baby has not successfully latched on to nipple:   Sucking sounds or smacking sounds from your baby while breastfeeding.  Nipple pain. If you think your baby has not latched on correctly, slip your finger into the corner of  your baby's mouth to break the suction and place it between your baby's gums. Attempt breastfeeding initiation again. Signs of Successful Breastfeeding Signs from your baby:   A gradual decrease in the number of sucks or complete cessation of sucking.   Falling asleep.   Relaxation of his or her body.   Retention of a small amount of milk in his or her mouth.   Letting go of your breast by himself or herself. Signs from you:  Breasts that have increased in firmness, weight, and size 1-3 hours after feeding.   Breasts that are softer immediately after breastfeeding.  Increased milk volume, as well as a change in milk consistency and color by the fifth day of breastfeeding.   Nipples that are not sore, cracked, or bleeding. Signs That Your Pecola Leisure is Getting Enough Milk  Wetting at least 3 diapers in a 24-hour period. The urine should be clear and pale yellow by age 732 days.  At least 3 stools in a 24-hour period by age 732 days. The stool should be soft and yellow.  At least 3 stools in a 24-hour period by age 264 days. The stool should be seedy and yellow.  No loss of weight greater than 10% of birth weight during the first 35 days of age.  Average weight gain of 4-7 ounces (113-198 g) per week after age 73 days.  Consistent daily weight gain by age 732 days, without weight loss after the age of 2 weeks. After a feeding, your baby may spit up a small amount. This is common. BREASTFEEDING FREQUENCY AND DURATION Frequent feeding will help you make more milk and can prevent sore nipples and breast engorgement. Breastfeed when you feel the need to reduce the  fullness of your breasts or when your baby shows signs of hunger. This is called "breastfeeding on demand." Avoid introducing a pacifier to your baby while you are working to establish breastfeeding (the first 4-6 weeks after your baby is born). After this time you may choose to use a pacifier. Research has shown that pacifier use  during the first year of a baby's life decreases the risk of sudden infant death syndrome (SIDS). Allow your baby to feed on each breast as long as he or she wants. Breastfeed until your baby is finished feeding. When your baby unlatches or falls asleep while feeding from the first breast, offer the second breast. Because newborns are often sleepy in the first few weeks of life, you may need to awaken your baby to get him or her to feed. Breastfeeding times will vary from baby to baby. However, the following rules can serve as a guide to help you ensure that your baby is properly fed:  Newborns (babies 30 weeks of age or younger) may breastfeed every 1-3 hours.  Newborns should not go longer than 3 hours during the day or 5 hours during the night without breastfeeding.  You should breastfeed your baby a minimum of 8 times in a 24-hour period until you begin to introduce solid foods to your baby at around 71 months of age. BREAST MILK PUMPING Pumping and storing breast milk allows you to ensure that your baby is exclusively fed your breast milk, even at times when you are unable to breastfeed. This is especially important if you are going back to work while you are still breastfeeding or when you are not able to be present during feedings. Your lactation consultant can give you guidelines on how long it is safe to store breast milk.  A breast pump is a machine that allows you to pump milk from your breast into a sterile bottle. The pumped breast milk can then be stored in a refrigerator or freezer. Some breast pumps are operated by hand, while others use electricity. Ask your lactation consultant which type will work best for you. Breast pumps can be purchased, but some hospitals and breastfeeding support groups lease breast pumps on a monthly basis. A lactation consultant can teach you how to hand express breast milk, if you prefer not to use a pump.  CARING FOR YOUR BREASTS WHILE YOU BREASTFEED Nipples  can become dry, cracked, and sore while breastfeeding. The following recommendations can help keep your breasts moisturized and healthy:  Avoid using soap on your nipples.   Wear a supportive bra. Although not required, special nursing bras and tank tops are designed to allow access to your breasts for breastfeeding without taking off your entire bra or top. Avoid wearing underwire-style bras or extremely tight bras.  Air dry your nipples for 3-64minutes after each feeding.   Use only cotton bra pads to absorb leaked breast milk. Leaking of breast milk between feedings is normal.   Use lanolin on your nipples after breastfeeding. Lanolin helps to maintain your skin's normal moisture barrier. If you use pure lanolin, you do not need to wash it off before feeding your baby again. Pure lanolin is not toxic to your baby. You may also hand express a few drops of breast milk and gently massage that milk into your nipples and allow the milk to air dry. In the first few weeks after giving birth, some women experience extremely full breasts (engorgement). Engorgement can make your breasts feel heavy, warm,  and tender to the touch. Engorgement peaks within 3-5 days after you give birth. The following recommendations can help ease engorgement:  Completely empty your breasts while breastfeeding or pumping. You may want to start by applying warm, moist heat (in the shower or with warm water-soaked hand towels) just before feeding or pumping. This increases circulation and helps the milk flow. If your baby does not completely empty your breasts while breastfeeding, pump any extra milk after he or she is finished.  Wear a snug bra (nursing or regular) or tank top for 1-2 days to signal your body to slightly decrease milk production.  Apply ice packs to your breasts, unless this is too uncomfortable for you.  Make sure that your baby is latched on and positioned properly while breastfeeding. If engorgement  persists after 48 hours of following these recommendations, contact your health care provider or a Advertising copywriterlactation consultant. OVERALL HEALTH CARE RECOMMENDATIONS WHILE BREASTFEEDING  Eat healthy foods. Alternate between meals and snacks, eating 3 of each per day. Because what you eat affects your breast milk, some of the foods may make your baby more irritable than usual. Avoid eating these foods if you are sure that they are negatively affecting your baby.  Drink milk, fruit juice, and water to satisfy your thirst (about 10 glasses a day).   Rest often, relax, and continue to take your prenatal vitamins to prevent fatigue, stress, and anemia.  Continue breast self-awareness checks.  Avoid chewing and smoking tobacco.  Avoid alcohol and drug use. Some medicines that may be harmful to your baby can pass through breast milk. It is important to ask your health care provider before taking any medicine, including all over-the-counter and prescription medicine as well as vitamin and herbal supplements. It is possible to become pregnant while breastfeeding. If birth control is desired, ask your health care provider about options that will be safe for your baby. SEEK MEDICAL CARE IF:   You feel like you want to stop breastfeeding or have become frustrated with breastfeeding.  You have painful breasts or nipples.  Your nipples are cracked or bleeding.  Your breasts are red, tender, or warm.  You have a swollen area on either breast.  You have a fever or chills.  You have nausea or vomiting.  You have drainage other than breast milk from your nipples.  Your breasts do not become full before feedings by the fifth day after you give birth.  You feel sad and depressed.  Your baby is too sleepy to eat well.  Your baby is having trouble sleeping.   Your baby is wetting less than 3 diapers in a 24-hour period.  Your baby has less than 3 stools in a 24-hour period.  Your baby's skin or the  white part of his or her eyes becomes yellow.   Your baby is not gaining weight by 455 days of age. SEEK IMMEDIATE MEDICAL CARE IF:   Your baby is overly tired (lethargic) and does not want to wake up and feed.  Your baby develops an unexplained fever. Document Released: 08/10/2005 Document Revised: 08/15/2013 Document Reviewed: 02/01/2013 [facility] Patient Information 2015 [place]. This information is not intended to replace advice given to you by your health care provider. Make sure you discuss any questions you have with your health care provider.

## 2014-03-15 NOTE — Discharge Summary (Signed)
Vaginal Delivery Discharge Summary  Robin [name]  DOB:    Feb 15, 1991 MRN:    [id number] CSN:    [id number]  Date of admission:                  03/13/2014  Date of discharge:                   03/16/2014  Procedures this admission:  NSVD   Date of Delivery: 03/14/2014  Newborn Data:  Live born female  Birth Weight: 7 lb 3.5 oz (3274 g) APGAR: 9, 9  Home with mother. Name: Robin [name]   History of Present Illness:  Robin [name] is a 23 y.o. female, [id number], who presents at [redacted]w[redacted]d weeks gestation. The patient has been followed at the [facility] and Gynecology division of [facility]. She was admitted onset of labor. Her pregnancy has been complicated by:  Patient Active Problem List   Diagnosis Date Noted  . Obesity, unspecified 02/11/2014  . PCOS (polycystic ovarian syndrome) 01/28/2012    Hospital course:  The patient was admitted for SROM.   Her labor was not complicated. She proceeded to have a vaginal delivery of a healthy infant. Her delivery was not complicated. Her postpartum course was not complicated.  She was discharged to home on postpartum day 2 doing well.  Feeding:  breast  Contraception:  Nexplanon  Discharge hemoglobin:  Hemoglobin  Date Value Ref Range Status  03/15/2014 9.5* 12.0 - 15.0 g/dL Final     HCT  Date Value Ref Range Status  03/15/2014 28.0* 36.0 - 46.0 % Final    Discharge Physical Exam:   General: alert and cooperative Lochia: appropriate Uterine Fundus: firm Incision: n/a DVT Evaluation: No evidence of DVT seen on physical exam.  Intrapartum Procedures: spontaneous vaginal delivery Postpartum Procedures: none Complications-Operative and Postpartum: none  Discharge Diagnoses: Term Pregnancy-delivered  Discharge Information:  Activity:           pelvic rest Diet:                routine Medications: PNV, Ibuprofen, Iron and Percocet Condition:      stable Instructions:     Postpartum Teaching: Nutrition, exercise, return to work or school, family visits, sexual activity, home rest, vaginal bleeding, pelvic rest, family planning, s/s of PPD, breast care peri-care and incision care  Discharge to: home  Follow-up Information   Follow up with [facility]. Schedule an appointment as soon as possible for a visit in 5 weeks. (Call with any questions or concerns)    Specialty:  Obstetrics and Gynecology   Contact information:   [address] [place] [address] [phone]       Robin [name], CNM, MSN 03/15/2014. 10:46 PM   Postpartum Care After Vaginal Delivery  After you deliver your newborn (postpartum period), the usual stay in the hospital is 24 72 hours. If there were problems with your labor or delivery, or if you have other medical problems, you might be in the hospital longer.  While you are in the hospital, you will receive help and instructions on how to care for yourself and your newborn during the postpartum period.  While you are in the hospital:  Be sure to tell your nurses if you have pain or discomfort, as well as where you feel the pain and what makes the pain worse.  If you had an incision made near your vagina (episiotomy) or if  you had some tearing during delivery, the nurses may put ice packs on your episiotomy or tear. The ice packs may help to reduce the pain and swelling.  If you are breastfeeding, you may feel uncomfortable contractions of your uterus for a couple of weeks. This is normal. The contractions help your uterus get back to normal size.  It is normal to have some bleeding after delivery.  For the first 1 3 days after delivery, the flow is red and the amount may be similar to a period.  It is common for the flow to start and stop.  In the first few days, you may pass some small clots. Let your nurses know if you begin to pass large clots or your flow  increases.  Do not  flush blood clots down the toilet before having the nurse look at them.  During the next 3 10 days after delivery, your flow should become more watery and pink or brown-tinged in color.  Ten to fourteen days after delivery, your flow should be a small amount of yellowish-white discharge.  The amount of your flow will decrease over the first few weeks after delivery. Your flow may stop in 6 8 weeks. Most women have had their flow stop by 12 weeks after delivery.  You should change your sanitary pads frequently.  Wash your hands thoroughly with soap and water for at least 20 seconds after changing pads, using the toilet, or before holding or feeding your newborn.  You should feel like you need to empty your bladder within the first 6 8 hours after delivery.  In case you become weak, lightheaded, or faint, call your nurse before you get out of bed for the first time and before you take a shower for the first time.  Within the first few days after delivery, your breasts may begin to feel tender and full. This is called engorgement. Breast tenderness usually goes away within 48 72 hours after engorgement occurs. You may also notice milk leaking from your breasts. If you are not breastfeeding, do not stimulate your breasts. Breast stimulation can make your breasts produce more milk.  Spending as much time as possible with your newborn is very important. During this time, you and your newborn can feel close and get to know each other. Having your newborn stay in your room (rooming in) will help to strengthen the bond with your newborn. It will give you time to get to know your newborn and become comfortable caring for your newborn.  Your hormones change after delivery. Sometimes the hormone changes can temporarily cause you to feel sad or tearful. These feelings should not last more than a few days. If these feelings last longer than that, you should talk to your caregiver.  If  desired, talk to your caregiver about methods of family planning or contraception.  Talk to your caregiver about immunizations. Your caregiver may want you to have the following immunizations before leaving the hospital:  Tetanus, diphtheria, and pertussis (Tdap) or tetanus and diphtheria (Td) immunization. It is very important that you and your family (including grandparents) or others caring for your newborn are up-to-date with the Tdap or Td immunizations. The Tdap or Td immunization can help protect your newborn from getting ill.  Rubella immunization.  Varicella (chickenpox) immunization.  Influenza immunization. You should receive this annual immunization if you did not receive the immunization during your pregnancy. Document Released: 06/07/2007 Document Revised: 05/04/2012 Document Reviewed: 04/06/2012 ExitCare Patient Information 2014 [employer].   Postpartum Depression and Baby Blues  The postpartum period begins right after the birth of a baby. During this time, there is often a great amount of joy and excitement. It is also a time of considerable changes in the life of the parent(s). Regardless of how many times a mother gives birth, each child brings new challenges and dynamics to the family. It is not unusual to have feelings of excitement accompanied by confusing shifts in moods, emotions, and thoughts. All mothers are at risk of developing postpartum depression or the "baby blues." These mood changes can occur right after giving birth, or they may occur many months after giving birth. The baby blues or postpartum depression can be mild or severe. Additionally, postpartum depression can resolve rather quickly, or it can be a long-term condition. CAUSES Elevated hormones and their rapid decline are thought to be a main cause of postpartum depression and the baby blues. There are a number of hormones that radically change during and after pregnancy. Estrogen and progesterone  usually decrease immediately after delivering your baby. The level of thyroid hormone and various cortisol steroids also rapidly drop. Other factors that play a major role in these changes include major life events and genetics.  RISK FACTORS If you have any of the following risks for the baby blues or postpartum depression, know what symptoms to watch out for during the postpartum period. Risk factors that may increase the likelihood of getting the baby blues or postpartum depression include:  Havinga personal or family history of depression.  Having depression while being pregnant.  Having premenstrual or oral contraceptive-associated mood issues.  Having exceptional life stress.  Having marital conflict.  Lacking a social support network.  Having a baby with special needs.  Having health problems such as diabetes. SYMPTOMS Baby blues symptoms include:  Brief fluctuations in mood, such as going from extreme happiness to sadness.  Decreased concentration.  Difficulty sleeping.  Crying spells, tearfulness.  Irritability.  Anxiety. Postpartum depression symptoms typically begin within the first month after giving birth. These symptoms include:  Difficulty sleeping or excessive sleepiness.  Marked weight loss.  Agitation.  Feelings of worthlessness.  Lack of interest in activity or food. Postpartum psychosis is a very concerning condition and can be dangerous. Fortunately, it is rare. Displaying any of the following symptoms is cause for immediate medical attention. Postpartum psychosis symptoms include:  Hallucinations and delusions.  Bizarre or disorganized behavior.  Confusion or disorientation. DIAGNOSIS  A diagnosis is made by an evaluation of your symptoms. There are no medical or lab tests that lead to a diagnosis, but there are various questionnaires that a caregiver may use to identify those with the baby blues, postpartum depression, or psychosis. Often  times, a screening tool called the [place] Postnatal Depression Scale is used to diagnose depression in the postpartum period.  TREATMENT The baby blues usually goes away on its own in 1 to 2 weeks. Social support is often all that is needed. You should be encouraged to get adequate sleep and rest. Occasionally, you may be given medicines to help you sleep.  Postpartum depression requires treatment as it can last several months or longer if it is not treated. Treatment may include individual or group therapy, medicine, or both to address any social, physiological, and psychological factors that may play a role in the depression. Regular exercise, a healthy diet, rest, and social support may also be strongly recommended.  Postpartum psychosis is more serious and needs  treatment right away. Hospitalization is often needed. HOME CARE INSTRUCTIONS  Get as much rest as you can. Nap when the baby sleeps.  Exercise regularly. Some women find yoga and walking to be beneficial.  Eat a balanced and nourishing diet.  Do little things that you enjoy. Have a cup of tea, take a bubble bath, read your favorite magazine, or listen to your favorite music.  Avoid alcohol.  Ask for help with household chores, cooking, grocery shopping, or running errands as needed. Do not try to do everything.  Talk to people close to you about how you are feeling. Get support from your partner, family members, friends, or other new moms.  Try to stay positive in how you think. Think about the things you are grateful for.  Do not spend a lot of time alone.  Only take medicine as directed by your caregiver.  Keep all your postpartum appointments.  Let your caregiver know if you have any concerns. SEEK MEDICAL CARE IF: You are having a reaction or problems with your medicine. SEEK IMMEDIATE MEDICAL CARE IF:  You have suicidal feelings.  You feel you may harm the baby or someone else. Document Released: 05/14/2004  Document Revised: 11/02/2011 Document Reviewed: 06/16/2011 [facility] Patient Information 2014 [place], [place].

## 2014-03-16 NOTE — Discharge Summary (Signed)
O pos. Rubella Immune.

## 2014-03-16 NOTE — Progress Notes (Signed)
UR chart review completed.  

## 2014-03-21 ENCOUNTER — Inpatient Hospital Stay (HOSPITAL_COMMUNITY): Admission: RE | Admit: 2014-03-21 | Payer: BC Managed Care – PPO | Source: Ambulatory Visit

## 2014-04-09 ENCOUNTER — Encounter (HOSPITAL_COMMUNITY): Payer: Self-pay | Admitting: *Deleted

## 2014-04-09 ENCOUNTER — Inpatient Hospital Stay (HOSPITAL_COMMUNITY)
Admission: AD | Admit: 2014-04-09 | Discharge: 2014-04-09 | Disposition: A | Discharge status: Home / Self Care | Payer: Medicaid Other | Source: Ambulatory Visit | Attending: Obstetrics and Gynecology | Admitting: Obstetrics and Gynecology

## 2014-04-09 DIAGNOSIS — K5900 Constipation, unspecified: Secondary | ICD-10-CM

## 2014-04-09 DIAGNOSIS — O9989 Other specified diseases and conditions complicating pregnancy, childbirth and the puerperium: Principal | ICD-10-CM

## 2014-04-09 DIAGNOSIS — O99893 Other specified diseases and conditions complicating puerperium: Secondary | ICD-10-CM | POA: Diagnosis not present

## 2014-04-09 LAB — WET PREP, GENITAL
Trich, Wet Prep: NONE SEEN
Yeast Wet Prep HPF POC: NONE SEEN

## 2014-04-09 MED ORDER — METRONIDAZOLE 500 MG PO TABS: 500.0000 mg | ORAL_TABLET | Freq: Two times a day (BID) | ORAL | Status: DC

## 2014-04-09 MED ORDER — DOCUSATE SODIUM 100 MG PO CAPS: 100.0000 mg | ORAL_CAPSULE | Freq: Every day | ORAL | Status: DC | PRN

## 2014-04-09 NOTE — MAU Provider Note (Signed)
History  23 yo G3 now P2012 s/p SVD on 03/14/14 presents to MAU after calling in tears w/ c/o feeling that her "uterus is falling out". States when she sat down to urinate, she was able to feel something protruding from her vagina and verified it by looking with a mirror. Denies pain. States lochia has stopped, but for the past 3 days, she's had a malodorous discharge. States has not resumed intercourse.   States has had issues with hemorrhoids since delivery. Has been using OTC products for relief.  Patient Active Problem List   Diagnosis Date Noted  . Unspecified constipation 04/09/2014  . Obesity, unspecified 02/11/2014  . PCOS (polycystic ovarian syndrome) 01/28/2012    No chief complaint on file.  HPI See above OB History   Grav Para Term Preterm Abortions TAB SAB Ect Mult Living   3 2 2  1  1   2       Past Medical History  Diagnosis Date  . No pertinent past medical history   . PCOS (polycystic ovarian syndrome)   . Miscarriage     Past Surgical History  Procedure Laterality Date  . Dilation and evacuation  06/18/2011    Procedure: DILATATION AND EVACUATION (D&E);  Surgeon: Hal Morales, MD;  Location: [facility];  Service: Gynecology;  Laterality: N/A;    Family History  Problem Relation Age of Onset  . Arthritis Mother   . Diabetes Maternal Aunt   . Diabetes Maternal Grandmother   . Cancer Maternal Grandmother     BREAST; POST MENOPAUSAL  . Hyperlipidemia Maternal Grandmother   . Hypertension Maternal Grandmother   . Anesthesia problems Neg Hx   . Sickle cell trait Father   . Sickle cell trait Sister   . Diabetes Maternal Uncle   . Kidney disease Maternal Uncle     DIALYSIS  . Alcohol abuse Maternal Uncle   . Drug abuse Maternal Uncle     History  Substance Use Topics  . Smoking status: Never Smoker   . Smokeless tobacco: Never Used  . Alcohol Use: No    Allergies: No Known Allergies  Prescriptions prior to admission  Medication Sig Dispense  Refill  . ferrous sulfate (FERROUSUL) 325 (65 FE) MG tablet Take 1 tablet (325 mg total) by mouth 2 (two) times daily with a meal.  60 tablet  1  . ibuprofen (ADVIL,MOTRIN) 600 MG tablet Take 1 tablet (600 mg total) by mouth every 6 (six) hours.  30 tablet  0  . oxyCODONE-acetaminophen (PERCOCET/ROXICET) 5-325 MG per tablet Take 1-2 tablets by mouth every 4 (four) hours as needed for severe pain (moderate - severe pain).  30 tablet  0  . Prenatal Vit-Fe Fumarate-FA (PRENATAL MULTIVITAMIN) TABS Take 1 tablet by mouth daily.  90 tablet  3    ROS Malodorous vaginal discharge ? Cervix protruding Physical Exam   Results for orders placed during the hospital encounter of 04/09/14 (from the past 24 hour(s))  WET PREP, GENITAL     Status: Abnormal   Collection Time    04/09/14  1:05 AM      Result Value Ref Range   Yeast Wet Prep HPF POC NONE SEEN  NONE SEEN   Trich, Wet Prep NONE SEEN  NONE SEEN   Clue Cells Wet Prep HPF POC FEW (*) NONE SEEN   WBC, Wet Prep HPF POC FEW (*) NONE SEEN    Blood pressure 119/76, pulse 62, temperature 97.7 F (36.5 C), temperature source Oral, resp.  rate 18, height 5\' 6"  (1.676 m), weight 187 lb (84.823 kg), SpO2 100.00%, unknown if currently breastfeeding.  Physical Exam Gen: NAD at present Vagina: No pain at introitus, no edema, no protrusion of cervix. Had normal delivery w/ intact perineum and no lacs. Minimal malodorous discharge; +whiff. Sample collected for wet prep. Spec/bimanual exam: Cervix posterior, long/closed, normal color. No external hemorrhoids seen. ED Course  Assessment: S/P SVD on 03/14/14 Normal vagina Cervix posterior, long/closed, pink Normal involution BV  Plan: Reasurrance Colace Metronidazole Keep pp appt on 04/26/14   [name], Choua Ikner CNM, MS 04/09/2014 1:27 AM

## 2014-04-09 NOTE — Discharge Instructions (Signed)
Bacterial Vaginosis Bacterial vaginosis is a vaginal infection that occurs when the normal balance of bacteria in the vagina is disrupted. It results from an overgrowth of certain bacteria. This is the most common vaginal infection in women of childbearing age. Treatment is important to prevent complications, especially in pregnant women, as it can cause a premature delivery. CAUSES  Bacterial vaginosis is caused by an increase in harmful bacteria that are normally present in smaller amounts in the vagina. Several different kinds of bacteria can cause bacterial vaginosis. However, the reason that the condition develops is not fully understood. RISK FACTORS Certain activities or behaviors can put you at an increased risk of developing bacterial vaginosis, including:  Having a new sex partner or multiple sex partners.  Douching.  Using an intrauterine device (IUD) for contraception. Women do not get bacterial vaginosis from toilet seats, bedding, swimming pools, or contact with objects around them. SIGNS AND SYMPTOMS  Some women with bacterial vaginosis have no signs or symptoms. Common symptoms include:  Grey vaginal discharge.  A fishlike odor with discharge, especially after sexual intercourse.  Itching or burning of the vagina and vulva.  Burning or pain with urination. DIAGNOSIS  Your health care provider will take a medical history and examine the vagina for signs of bacterial vaginosis. A sample of vaginal fluid may be taken. Your health care provider will look at this sample under a microscope to check for bacteria and abnormal cells. A vaginal pH test may also be done.  TREATMENT  Bacterial vaginosis may be treated with antibiotic medicines. These may be given in the form of a pill or a vaginal cream. A second round of antibiotics may be prescribed if the condition comes back after treatment.  HOME CARE INSTRUCTIONS   Only take over-the-counter or prescription medicines as  directed by your health care provider.  If antibiotic medicine was prescribed, take it as directed. Make sure you finish it even if you start to feel better.  Do not have sex until treatment is completed.  Tell all sexual partners that you have a vaginal infection. They should see their health care provider and be treated if they have problems, such as a mild rash or itching.  Practice safe sex by using condoms and only having one sex partner. SEEK MEDICAL CARE IF:   Your symptoms are not improving after 3 days of treatment.  You have increased discharge or pain.  You have a fever. MAKE SURE YOU:   Understand these instructions.  Will watch your condition.  Will get help right away if you are not doing well or get worse. FOR MORE INFORMATION  Centers for Disease Control and Prevention, Division of STD Prevention: www.cdc.gov/std American Sexual Health Association (ASHA): www.ashastd.org  Document Released: 08/10/2005 Document Revised: 05/31/2013 Document Reviewed: 03/22/2013 ExitCare Patient Information 2015 ExitCare, LLC. This information is not intended to replace advice given to you by your health care provider. Make sure you discuss any questions you have with your health care provider.  

## 2014-04-09 NOTE — MAU Note (Signed)
Pt reports she has a "mass" hanging out of her vagina, has been constipated and was seen in MD's office last week. Denies vaginal bleeding, but she has had a foul smelling discharge. S/P vaginal delivery on 07/22

## 2014-06-25 ENCOUNTER — Encounter (HOSPITAL_COMMUNITY): Payer: Self-pay | Admitting: *Deleted

## 2015-07-13 ENCOUNTER — Encounter (HOSPITAL_COMMUNITY): Payer: Self-pay | Admitting: *Deleted

## 2015-07-13 ENCOUNTER — Inpatient Hospital Stay (HOSPITAL_COMMUNITY)
Admission: AD | Admit: 2015-07-13 | Discharge: 2015-07-13 | Disposition: A | Discharge status: Home / Self Care | Payer: 59 | Source: Ambulatory Visit | Attending: Obstetrics and Gynecology | Admitting: Obstetrics and Gynecology

## 2015-07-13 DIAGNOSIS — O26891 Other specified pregnancy related conditions, first trimester: Secondary | ICD-10-CM | POA: Diagnosis present

## 2015-07-13 DIAGNOSIS — Z3A09 9 weeks gestation of pregnancy: Secondary | ICD-10-CM | POA: Diagnosis not present

## 2015-07-13 DIAGNOSIS — R109 Unspecified abdominal pain: Secondary | ICD-10-CM | POA: Diagnosis not present

## 2015-07-13 DIAGNOSIS — K429 Umbilical hernia without obstruction or gangrene: Secondary | ICD-10-CM

## 2015-07-13 LAB — URINALYSIS, ROUTINE W REFLEX MICROSCOPIC
Bilirubin Urine: NEGATIVE
Glucose, UA: NEGATIVE mg/dL
Hgb urine dipstick: NEGATIVE
Ketones, ur: NEGATIVE mg/dL
Leukocytes, UA: NEGATIVE
Nitrite: NEGATIVE
PH: 6 (ref 5.0–8.0)
Protein, ur: NEGATIVE mg/dL

## 2015-07-13 LAB — POCT PREGNANCY, URINE: Preg Test, Ur: POSITIVE — AB

## 2015-07-13 MED ORDER — OXYCODONE-ACETAMINOPHEN 5-325 MG PO TABS
2.0000 | ORAL_TABLET | Freq: Once | ORAL | Status: AC
  Administered 2015-07-13: 2 via ORAL
  Filled 2015-07-13: qty 2

## 2015-07-13 MED ORDER — OXYCODONE-ACETAMINOPHEN 5-325 MG PO TABS: 1.0000 | ORAL_TABLET | Freq: Four times a day (QID) | ORAL | Status: DC | PRN

## 2015-07-13 NOTE — Discharge Instructions (Signed)
Hernia, Adult A hernia is the bulging of an organ or tissue through a weak spot in the muscles of the abdomen (abdominal wall). Hernias develop most often near the navel or groin. There are many kinds of hernias. Common kinds include:  Femoral hernia. This kind of hernia develops under the groin in the upper thigh area.  Inguinal hernia. This kind of hernia develops in the groin or scrotum.  Umbilical hernia. This kind of hernia develops near the navel.  Hiatal hernia. This kind of hernia causes part of the stomach to be pushed up into the chest.  Incisional hernia. This kind of hernia bulges through a scar from an abdominal surgery. CAUSES This condition may be caused by:  Heavy lifting.  Coughing over a long period of time.  Straining to have a bowel movement.  An incision made during an abdominal surgery.  A birth defect (congenital defect).  Excess weight or obesity.  Smoking.  Poor nutrition.  Cystic fibrosis.  Excess fluid in the abdomen.  Undescended testicles. SYMPTOMS Symptoms of a hernia include:  A lump on the abdomen. This is the first sign of a hernia. The lump may become more obvious with standing, straining, or coughing. It may get bigger over time if it is not treated or if the condition causing it is not treated.  Pain. A hernia is usually painless, but it may become painful over time if treatment is delayed. The pain is usually dull and may get worse with standing or lifting heavy objects. Sometimes a hernia gets tightly squeezed in the weak spot (strangulated) or stuck there (incarcerated) and causes additional symptoms. These symptoms may include:  Vomiting.  Nausea.  Constipation.  Irritability. DIAGNOSIS A hernia may be diagnosed with:  A physical exam. During the exam your health care provider may ask you to cough or to make a specific movement, because a hernia is usually more visible when you move.  Imaging tests. These can  include:  X-rays.  Ultrasound.  CT scan. TREATMENT A hernia that is small and painless may not need to be treated. A hernia that is large or painful may be treated with surgery. Inguinal hernias may be treated with surgery to prevent incarceration or strangulation. Strangulated hernias are always treated with surgery, because lack of blood to the trapped organ or tissue can cause it to die. Surgery to treat a hernia involves pushing the bulge back into place and repairing the weak part of the abdomen. HOME CARE INSTRUCTIONS  Avoid straining.  Do not lift anything heavier than 10 lb (4.5 kg).  Lift with your leg muscles, not your back muscles. This helps avoid strain.  When coughing, try to cough gently.  Prevent constipation. Constipation leads to straining with bowel movements, which can make a hernia worse or cause a hernia repair to break down. You can prevent constipation by:  Eating a high-fiber diet that includes plenty of fruits and vegetables.  Drinking enough fluids to keep your urine clear or pale yellow. Aim to drink 6-8 glasses of water per day.  Using a stool softener as directed by your health care provider.  Lose weight, if you are overweight.  Do not use any tobacco products, including cigarettes, chewing tobacco, or electronic cigarettes. If you need help quitting, ask your health care provider.  Keep all follow-up visits as directed by your health care provider. This is important. Your health care provider may need to monitor your condition. SEEK MEDICAL CARE IF:  You have   swelling, redness, and pain in the affected area.  Your bowel habits change. SEEK IMMEDIATE MEDICAL CARE IF:  You have a fever.  You have abdominal pain that is getting worse.  You feel nauseous or you vomit.  You cannot push the hernia back in place by gently pressing on it while you are lying down.  The hernia:  Changes in shape or size.  Is stuck outside the  abdomen.  Becomes discolored.  Feels hard or tender.   This information is not intended to replace advice given to you by your health care provider. Make sure you discuss any questions you have with your health care provider.   Document Released: 08/10/2005 Document Revised: 08/31/2014 Document Reviewed: 06/20/2014 Elsevier Interactive Patient Education 2016 Elsevier Inc.  

## 2015-07-13 NOTE — MAU Note (Signed)
Pt has hx of umbilical hernia, started having umbilical pain @ approx 2 p.m.  Pain is constant, worse when bending over, tender to touch.  Denies vag bleeding or discharge.  Has first appt with [facility] on 12/12.

## 2015-07-13 NOTE — MAU Provider Note (Signed)
History    Robin [name] is a 24 y.o. [id number] at 9.1wks who presents, unannounced, for known umbilical hernia.  Patient states she has been having umbilical pain and posterior umbilical tenderness since 2pm.  Patient states pain started suddenly and has been constant.  Patient states that she had had a "bojangles biscuit and then I started having pressure."  Patient states that when ambulating it was painful and "hard to push in."  Patient states that since laying down symptoms have improved.  Patient denies n/v, constipation, fever, chills, or unbearable pain.   Patient Active Problem List   Diagnosis Date Noted  . Unspecified constipation 04/09/2014  . Obesity, unspecified 02/11/2014  . PCOS (polycystic ovarian syndrome) 01/28/2012    Chief Complaint  Patient presents with  . Abdominal Pain   HPI  OB History    Gravida Para Term Preterm AB TAB SAB Ectopic Multiple Living   4 2 2  1  1   2       Past Medical History  Diagnosis Date  . No pertinent past medical history   . PCOS (polycystic ovarian syndrome)   . Miscarriage     Past Surgical History  Procedure Laterality Date  . Dilation and evacuation  06/18/2011    Procedure: DILATATION AND EVACUATION (D&E);  Surgeon: Hal MoralesVanessa P Haygood, MD;  Location: [facility];  Service: Gynecology;  Laterality: N/A;    Family History  Problem Relation Age of Onset  . Arthritis Mother   . Diabetes Maternal Aunt   . Diabetes Maternal Grandmother   . Cancer Maternal Grandmother     BREAST; POST MENOPAUSAL  . Hyperlipidemia Maternal Grandmother   . Hypertension Maternal Grandmother   . Anesthesia problems Neg Hx   . Sickle cell trait Father   . Sickle cell trait Sister   . Diabetes Maternal Uncle   . Kidney disease Maternal Uncle     DIALYSIS  . Alcohol abuse Maternal Uncle   . Drug abuse Maternal Uncle     Social History  Substance Use Topics  . Smoking status: Never Smoker   . Smokeless tobacco: Never Used  . Alcohol Use: No     Allergies: No Known Allergies  Prescriptions prior to admission  Medication Sig Dispense Refill Last Dose  . Prenatal Vit-Fe Fumarate-FA (PRENATAL MULTIVITAMIN) TABS tablet Take 1 tablet by mouth daily.   07/13/2015 at Unknown time    ROS  See HPI Above Physical Exam   Blood pressure 112/62, pulse 73, temperature 98.8 F (37.1 C), temperature source Oral, resp. rate 18, last menstrual period 05/10/2015, unknown if currently breastfeeding.  Results for orders placed or performed during the hospital encounter of 07/13/15 (from the past 24 hour(s))  Urinalysis, Routine w reflex microscopic (not at [facility])     Status: Abnormal   Collection Time: 07/13/15  5:13 PM  Result Value Ref Range   Color, Urine YELLOW YELLOW   APPearance CLEAR CLEAR   Specific Gravity, Urine >1.030 (H) 1.005 - 1.030   pH 6.0 5.0 - 8.0   Glucose, UA NEGATIVE NEGATIVE mg/dL   Hgb urine dipstick NEGATIVE NEGATIVE   Bilirubin Urine NEGATIVE NEGATIVE   Ketones, ur NEGATIVE NEGATIVE mg/dL   Protein, ur NEGATIVE NEGATIVE mg/dL   Nitrite NEGATIVE NEGATIVE   Leukocytes, UA NEGATIVE NEGATIVE  Pregnancy, urine POC     Status: Abnormal   Collection Time: 07/13/15  5:21 PM  Result Value Ref Range   Preg Test, Ur POSITIVE (A) NEGATIVE    Physical  Exam  Constitutional: She is oriented to person, place, and time. She appears well-developed and well-nourished. No distress.  HENT:  Head: Normocephalic and atraumatic.  Eyes: EOM are normal.  Neck: Normal range of motion.  Cardiovascular: Normal rate, regular rhythm and normal heart sounds.   Respiratory: Effort normal and breath sounds normal.  GI: Soft. Bowel sounds are normal. She exhibits no distension and no mass. There is tenderness in the periumbilical area. There is no rebound and no guarding. A hernia is present.  Tenderness noted from umbilicus to 2 fb below.  No masses palpated, hernia easily reduced.    Musculoskeletal: Normal range of motion.   Neurological: She is alert and oriented to person, place, and time.  Skin: Skin is warm and dry.     FHR: Not assessed d/t GA ED Course  Assessment: IUP at 9.1wks Umbilical Hernia Pain  Plan: -PE as above -Educated regarding umbilical hernia including s/s of worsening condition; nausea, vomiting, pain at site, constipation -Encouraged to rest tomorrow, if possible -Discussed who and when to call for pregnancy related concerns -Informed of need to seek treatment at [facility] if symptoms worsen -Give percocet 2 tabs now -Rx percocet 1-2 tabs every 6 hrs prn.  Disp 30, RF 0 -Discharged to home in improved condition Keep appt as scheduled: 08/05/2015   Cherre Robins CNM, MSN 07/13/2015 6:49 PM

## 2015-08-25 NOTE — L&D Delivery Note (Signed)
Pt candidate for hydrotherapy only as fluid after SROM was noted to be dark green w/ a 'pea soup' consistency. To bed for imminent delivery; placed on continuous monitoring. Cat 1 tracing and regular ctxs noted throughout 1st and 2nd stages of labor.  Delivery Note At 6:44 AM a viable female "unnamed" was delivered via Vaginal, Spontaneous Delivery (Presentation: OA restituting to Left Occiput Anterior).  APGARS: 8, 9; weight 8 lb 8.2 oz (3860 g).   Placenta status: Intact, Spontaneous Schultz. Cord: 3 vessels with the following complications: Short. Cord pH: NA  Pt w/o saline lock, therefore 10 units of Pitocin was given IM post delivery of placenta.  Anesthesia: None  Episiotomy: None Lacerations: None Suture Repair: NA Est. Blood Loss (mL): 300  Mom to postpartum.  Baby to Couplet care / Skin to Skin.  Mom plans to breastfeed.  Thinking about Paragard IUD for contraception.  Plans outpt circ.  Mayford KnifeWILLIAMS, Ulysee Fyock 02/19/2016, 9 AM

## 2015-12-12 LAB — OB RESULTS CONSOLE RPR: RPR: NONREACTIVE

## 2015-12-31 LAB — OB RESULTS CONSOLE HIV ANTIBODY (ROUTINE TESTING): HIV: NONREACTIVE

## 2016-01-21 LAB — OB RESULTS CONSOLE GC/CHLAMYDIA
CHLAMYDIA, DNA PROBE: NEGATIVE
GC PROBE AMP, GENITAL: POSITIVE
GC PROBE AMP, GENITAL: NEGATIVE
GC PROBE AMP, GENITAL: NEGATIVE

## 2016-02-19 ENCOUNTER — Encounter (HOSPITAL_COMMUNITY): Payer: Self-pay | Admitting: *Deleted

## 2016-02-19 ENCOUNTER — Inpatient Hospital Stay (HOSPITAL_COMMUNITY)
Admission: AD | Admit: 2016-02-19 | Discharge: 2016-02-21 | DRG: 775 | Disposition: A | Discharge status: Home / Self Care | Payer: 59 | Source: Ambulatory Visit | Attending: Obstetrics and Gynecology | Admitting: Obstetrics and Gynecology

## 2016-02-19 DIAGNOSIS — K429 Umbilical hernia without obstruction or gangrene: Secondary | ICD-10-CM | POA: Diagnosis present

## 2016-02-19 DIAGNOSIS — Z3A40 40 weeks gestation of pregnancy: Secondary | ICD-10-CM | POA: Diagnosis not present

## 2016-02-19 DIAGNOSIS — O99824 Streptococcus B carrier state complicating childbirth: Secondary | ICD-10-CM | POA: Diagnosis present

## 2016-02-19 DIAGNOSIS — B951 Streptococcus, group B, as the cause of diseases classified elsewhere: Secondary | ICD-10-CM

## 2016-02-19 DIAGNOSIS — E282 Polycystic ovarian syndrome: Secondary | ICD-10-CM | POA: Diagnosis present

## 2016-02-19 DIAGNOSIS — Z6832 Body mass index (BMI) 32.0-32.9, adult: Secondary | ICD-10-CM

## 2016-02-19 DIAGNOSIS — O99214 Obesity complicating childbirth: Secondary | ICD-10-CM | POA: Diagnosis present

## 2016-02-19 LAB — CBC
HEMATOCRIT: 36.7 % (ref 36.0–46.0)
Hemoglobin: 13 g/dL (ref 12.0–15.0)
MCH: 31.6 pg (ref 26.0–34.0)
MCHC: 35.4 g/dL (ref 30.0–36.0)
MCV: 89.1 fL (ref 78.0–100.0)
Platelets: 206 10*3/uL (ref 150–400)
RBC: 4.12 MIL/uL (ref 3.87–5.11)
RDW: 14 % (ref 11.5–15.5)
WBC: 8.9 10*3/uL (ref 4.0–10.5)

## 2016-02-19 LAB — TYPE AND SCREEN
ABO/RH(D): O POS
ANTIBODY SCREEN: NEGATIVE

## 2016-02-19 LAB — RPR: RPR Ser Ql: NONREACTIVE

## 2016-02-19 MED ORDER — SENNOSIDES-DOCUSATE SODIUM 8.6-50 MG PO TABS
2.0000 | ORAL_TABLET | ORAL | Status: DC
  Administered 2016-02-19 – 2016-02-20 (×2): 2 via ORAL
  Filled 2016-02-19 (×2): qty 2

## 2016-02-19 MED ORDER — OXYCODONE-ACETAMINOPHEN 5-325 MG PO TABS: 2.0000 | ORAL_TABLET | ORAL | Status: DC | PRN

## 2016-02-19 MED ORDER — OXYTOCIN 40 UNITS IN LACTATED RINGERS INFUSION - SIMPLE MED: 2.5000 [IU]/h | INTRAVENOUS | Status: DC

## 2016-02-19 MED ORDER — FENTANYL CITRATE (PF) 100 MCG/2ML IJ SOLN: 50.0000 ug | INTRAMUSCULAR | Status: DC | PRN

## 2016-02-19 MED ORDER — DIBUCAINE 1 % RE OINT: 1.0000 "application " | TOPICAL_OINTMENT | RECTAL | Status: DC | PRN

## 2016-02-19 MED ORDER — ONDANSETRON HCL 4 MG/2ML IJ SOLN: 4.0000 mg | INTRAMUSCULAR | Status: DC | PRN

## 2016-02-19 MED ORDER — OXYCODONE-ACETAMINOPHEN 5-325 MG PO TABS: 1.0000 | ORAL_TABLET | ORAL | Status: DC | PRN

## 2016-02-19 MED ORDER — BENZOCAINE-MENTHOL 20-0.5 % EX AERO
1.0000 "application " | INHALATION_SPRAY | CUTANEOUS | Status: DC | PRN
  Administered 2016-02-19: 1 via TOPICAL
  Filled 2016-02-19: qty 56

## 2016-02-19 MED ORDER — LIDOCAINE HCL (PF) 1 % IJ SOLN
30.0000 mL | INTRAMUSCULAR | Status: DC | PRN
  Filled 2016-02-19: qty 30

## 2016-02-19 MED ORDER — ONDANSETRON HCL 4 MG PO TABS
4.0000 mg | ORAL_TABLET | ORAL | Status: DC | PRN
Start: 2016-02-19 — End: 2016-02-21

## 2016-02-19 MED ORDER — SODIUM CHLORIDE 0.9% FLUSH: 3.0000 mL | INTRAVENOUS | Status: DC | PRN

## 2016-02-19 MED ORDER — OXYTOCIN BOLUS FROM INFUSION: 500.0000 mL | INTRAVENOUS | Status: DC

## 2016-02-19 MED ORDER — SODIUM CHLORIDE 0.9 % IV SOLN
2.0000 g | Freq: Once | INTRAVENOUS | Status: AC
  Administered 2016-02-19: 2 g via INTRAVENOUS
  Filled 2016-02-19: qty 2000

## 2016-02-19 MED ORDER — ACETAMINOPHEN 325 MG PO TABS
650.0000 mg | ORAL_TABLET | ORAL | Status: DC | PRN
  Filled 2016-02-19: qty 2

## 2016-02-19 MED ORDER — LACTATED RINGERS IV SOLN: 500.0000 mL | INTRAVENOUS | Status: DC | PRN

## 2016-02-19 MED ORDER — FERROUS SULFATE 325 (65 FE) MG PO TABS
325.0000 mg | ORAL_TABLET | Freq: Two times a day (BID) | ORAL | Status: DC
  Administered 2016-02-19 – 2016-02-21 (×4): 325 mg via ORAL
  Filled 2016-02-19 (×4): qty 1

## 2016-02-19 MED ORDER — PRENATAL MULTIVITAMIN CH
1.0000 | ORAL_TABLET | Freq: Every day | ORAL | Status: DC
  Administered 2016-02-19 – 2016-02-21 (×3): 1 via ORAL
  Filled 2016-02-19 (×3): qty 1

## 2016-02-19 MED ORDER — METHYLERGONOVINE MALEATE 0.2 MG/ML IJ SOLN: 0.2000 mg | INTRAMUSCULAR | Status: DC | PRN

## 2016-02-19 MED ORDER — COCONUT OIL OIL: 1.0000 "application " | TOPICAL_OIL | Status: DC | PRN

## 2016-02-19 MED ORDER — ONDANSETRON HCL 4 MG/2ML IJ SOLN: 4.0000 mg | Freq: Four times a day (QID) | INTRAMUSCULAR | Status: DC | PRN

## 2016-02-19 MED ORDER — DIPHENHYDRAMINE HCL 25 MG PO CAPS: 25.0000 mg | ORAL_CAPSULE | Freq: Four times a day (QID) | ORAL | Status: DC | PRN

## 2016-02-19 MED ORDER — FLEET ENEMA 7-19 GM/118ML RE ENEM: 1.0000 | ENEMA | RECTAL | Status: DC | PRN

## 2016-02-19 MED ORDER — OXYTOCIN 10 UNIT/ML IJ SOLN
INTRAMUSCULAR | Status: AC
  Administered 2016-02-19: 10 [IU]
  Filled 2016-02-19: qty 1

## 2016-02-19 MED ORDER — ACETAMINOPHEN 325 MG PO TABS
650.0000 mg | ORAL_TABLET | ORAL | Status: DC | PRN
  Administered 2016-02-19: 650 mg via ORAL
  Filled 2016-02-19: qty 2

## 2016-02-19 MED ORDER — WITCH HAZEL-GLYCERIN EX PADS: 1.0000 "application " | MEDICATED_PAD | CUTANEOUS | Status: DC | PRN

## 2016-02-19 MED ORDER — ZOLPIDEM TARTRATE 5 MG PO TABS
5.0000 mg | ORAL_TABLET | Freq: Every evening | ORAL | Status: DC | PRN
Start: 2016-02-19 — End: 2016-02-21

## 2016-02-19 MED ORDER — TETANUS-DIPHTH-ACELL PERTUSSIS 5-2.5-18.5 LF-MCG/0.5 IM SUSP: 0.5000 mL | Freq: Once | INTRAMUSCULAR | Status: DC

## 2016-02-19 MED ORDER — SODIUM CHLORIDE 0.9 % IV SOLN
1.0000 g | INTRAVENOUS | Status: DC
  Filled 2016-02-19 (×2): qty 1000

## 2016-02-19 MED ORDER — METHYLERGONOVINE MALEATE 0.2 MG PO TABS: 0.2000 mg | ORAL_TABLET | ORAL | Status: DC | PRN

## 2016-02-19 MED ORDER — IBUPROFEN 600 MG PO TABS
600.0000 mg | ORAL_TABLET | Freq: Four times a day (QID) | ORAL | Status: DC
  Administered 2016-02-19 – 2016-02-21 (×9): 600 mg via ORAL
  Filled 2016-02-19 (×9): qty 1

## 2016-02-19 MED ORDER — SOD CITRATE-CITRIC ACID 500-334 MG/5ML PO SOLN: 30.0000 mL | ORAL | Status: DC | PRN

## 2016-02-19 MED ORDER — SIMETHICONE 80 MG PO CHEW: 80.0000 mg | CHEWABLE_TABLET | ORAL | Status: DC | PRN

## 2016-02-19 NOTE — H&P (Signed)
Robin [name] is a 25 y.o. female presenting w/ regular, painful ctxs since 02:50 today, [id number] @ 40.[redacted] Weeks EGA by certain LMP;EDC 02/14/16. Desires waterbirth delivery. Has attended class and signed consents.  Antepartum course significant for: 1) Positive GBS - NKDA 2) Umbilical hernia (f/u pp) 3) Obesity (BMI 30+) 4) Late to care (entered at 15.6 wks) 5) Polycystic ovaries  History OB History    Gravida Para Term Preterm AB TAB SAB Ectopic Multiple Living   4 2 2  1  1   2     SAB at 9 wks in 2012 - underwent D&E on 06/18/2011 by Dr. Pennie [name] SVB on 09/14/2012 at 39 wks, female infant, birthwt 7+8 SVB on 03/14/2014 @ 40 wks, female infant, birthwt 7+3 -- [facility]  Past Medical History  Diagnosis Date  . No pertinent past medical history   . PCOS (polycystic ovarian syndrome)   . Miscarriage    Past Surgical History  Procedure Laterality Date  . Dilation and evacuation  06/18/2011    Procedure: DILATATION AND EVACUATION (D&E);  Surgeon: Robin MoralesVanessa P Haygood, MD;  Location: [facility];  Service: Gynecology;  Laterality: N/A;   Family History: family history includes Alcohol abuse in her maternal uncle; Arthritis in her mother; Cancer in her maternal grandmother; Diabetes in her maternal aunt, maternal grandmother, and maternal uncle; Drug abuse in her maternal uncle; Hyperlipidemia in her maternal grandmother; Hypertension in her maternal grandmother; Kidney disease in her maternal uncle; Sickle cell trait in her father and sister. There is no history of Anesthesia problems. Social History:  reports that she has never smoked. She has never used smokeless tobacco. She reports that she does not drink alcohol or use illicit drugs.   Prenatal Transfer Tool  Maternal Diabetes: No Genetic Screening: Normal Maternal Ultrasounds/Referrals: Normal Fetal Ultrasounds or other Referrals:  None Maternal Substance Abuse:  No Significant Maternal Medications:  Meds include: Other: PNV Significant  Maternal Lab Results:  Lab values include: Group B Strep positive Other Comments:  None  ROS 10 Systems reviewed and are negative for acute change except as noted in the HPI.    Dilation: 4.5 Effacement (%): 90 Station: -2 Exam by:: Robin Enganielle Simpson RN@ 03:50 AM Blood pressure 130/84, last menstrual period 05/10/2015, unknown if currently breastfeeding. Maternal Exam:  Uterine Assessment: Contraction strength is moderate.  Contraction duration is 40 seconds. Contraction frequency is regular.   Abdomen: Patient reports no abdominal tenderness. Fundal height is CWD.   Estimated fetal weight is 7 1/2 lbs.   Fetal presentation: vertex  Introitus: Normal vulva. Normal vagina.    Fetal Exam Fetal Monitor Review: Baseline rate: 135 bpm.  Variability: moderate (6-25 bpm).   Pattern: accelerations present and no decelerations.    Fetal State Assessment: Category I - tracings are normal.    Pelvis proven to 7lbs 8 oz Physical Exam  Nursing note and vitals reviewed.    Constitutional: She is oriented to person, place, and time. She appears well-developed and well-nourished.  HENT:  Head: Normocephalic and atraumatic.  Neck: Normal range of motion.  Cardiovascular: Normal rate, regular rhythm and normal heart sounds.  Respiratory: Effort normal and breath sounds normal.  GI: Soft. Bowel sounds are normal. Abdomen is gravid.  Neurological: She is alert and oriented to person, place, and time.  Skin: Skin is warm and dry.  Psychiatric: She has a normal mood and affect. Her behavior is normal.   Prenatal labs: ABO, Rh: O positive Antibody: Neg Rubella: Immune RPR: NR HBsAg:  Neg  HIV: NR GBS: Positive (01/16/16) Results for orders placed or performed during the hospital encounter of 02/19/16 (from the past 24 hour(s))  CBC     Status: None   Collection Time: 02/19/16  4:05 AM  Result Value Ref Range   WBC 8.9 4.0 - 10.5 K/uL   RBC 4.12 3.87 - 5.11 MIL/uL   Hemoglobin 13.0  12.0 - 15.0 g/dL   HCT 16.136.7 09.636.0 - 04.546.0 %   MCV 89.1 78.0 - 100.0 fL   MCH 31.6 26.0 - 34.0 pg   MCHC 35.4 30.0 - 36.0 g/dL   RDW 40.914.0 81.111.5 - 91.415.5 %   Platelets 206 150 - 400 K/uL   Assessment: IUP at 40.5 wks Latent phase labor FWB: Cat 1 GBS positive Obesity Waterbirth candidate currently  [facility]: [facility] Rout[facility] orders Intermittent monitoring Expectant management Ampicillin prophylaxis per standard dosing  Expect progress and SVD   Robin [name], Robin [name] 02/19/2016, 4:21 AM

## 2016-02-19 NOTE — MAU Note (Signed)
Contractions started about an hour ago. Denies LOF or vag bleeding. +FM. 3.5cm on last exam

## 2016-02-19 NOTE — Lactation Note (Signed)
This note was copied from a baby's chart. Lactation Consultation Note  Patient Name: Robin [name] ZOXWR'UToday's Date: 02/19/2016 Reason for consult: Initial assessment Baby at 9 hr of life. Experienced bf mom reports latching is going well. She denies breast or nipple pain, voiced no concerns. Discussed baby behavior, feeding frequency, voids, wt loss, breast changes, and nipple care. She stated she can manually express and has spoon in room. Given lactation handouts. Aware of OP services and support group.     Maternal Data Has patient been taught Hand Expression?: Yes Does the patient have breastfeeding experience prior to this delivery?: Yes  Feeding Feeding Type: Breast Fed Length of feed: 15 min  LATCH Score/Interventions                      Lactation Tools Discussed/Used WIC Program: Yes   Consult Status Consult Status: Follow-up Date: 02/20/16 Follow-up type: In-patient    [name] 02/19/2016, 4:16 PM

## 2016-02-20 LAB — CBC
HEMATOCRIT: 30.3 % — AB (ref 36.0–46.0)
HEMOGLOBIN: 10.4 g/dL — AB (ref 12.0–15.0)
MCH: 31 pg (ref 26.0–34.0)
MCHC: 34.3 g/dL (ref 30.0–36.0)
MCV: 90.4 fL (ref 78.0–100.0)
Platelets: 199 10*3/uL (ref 150–400)
RBC: 3.35 MIL/uL — ABNORMAL LOW (ref 3.87–5.11)
RDW: 14.4 % (ref 11.5–15.5)
WBC: 11.6 10*3/uL — ABNORMAL HIGH (ref 4.0–10.5)

## 2016-02-20 NOTE — Lactation Note (Signed)
This note was copied from a baby's chart. Lactation Consultation Note: Mother states that infant has been cluster feeding. She states she just finished a feeding. Mother has infant lying skin to skin. Mother denies having any concerns about breastfeeding she states she breastfed her other two children without problems. Mother is aware of available LC services and community support.   Patient Name: Robin Caryn BeeBrittany Lavalais IONGE'XToday's Date: 02/20/2016 Reason for consult: Follow-up assessment   Maternal Data    Feeding Feeding Type: Breast Fed Length of feed: 10 min  LATCH Score/Interventions                      Lactation Tools Discussed/Used     Consult Status Consult Status: Follow-up Date: 02/21/16 Follow-up type: In-patient    Stevan BornKendrick, Cynthia Cogle [facility] 02/20/2016, 1:11 PM

## 2016-02-20 NOTE — Progress Notes (Signed)
Post Partum Day 1  Subjective: no complaints, up ad lib, voiding and tolerating PO  Objective: Blood pressure 102/67, pulse 86, temperature 97.8 F (36.6 C), temperature source Axillary, resp. rate 18, height 5\' 5"  (1.651 m), weight 198 lb (89.812 kg), last menstrual period 05/10/2015, SpO2 100 %, unknown if currently breastfeeding.  Physical Exam:  General: alert and no distress Lochia: appropriate Uterine Fundus: firm Incision: n/a DVT Evaluation: No evidence of DVT seen on physical exam.   Recent Labs  02/19/16 0405 02/20/16 0521  HGB 13.0 10.4*  HCT 36.7 30.3*    Assessment/Plan: Plan for discharge tomorrow and Breastfeeding  Plans outpt circ Cont PP care   LOS: 1 day   Kavya Haag Y 02/20/2016, 3:01 PM

## 2016-02-21 MED ORDER — IBUPROFEN 600 MG PO TABS: 600.0000 mg | ORAL_TABLET | Freq: Four times a day (QID) | ORAL | Status: DC | PRN

## 2016-02-21 NOTE — Discharge Summary (Signed)
Obstetric Discharge Summary Reason for Admission: onset of labor Prenatal Procedures: ultrasound Intrapartum Procedures: spontaneous vaginal delivery Postpartum Procedures: none Complications-Operative and Postpartum: none HEMOGLOBIN  Date Value Ref Range Status  02/20/2016 10.4* 12.0 - 15.0 g/dL Final   HCT  Date Value Ref Range Status  02/20/2016 30.3* 36.0 - 46.0 % Final    Physical Exam:  General: alert and no distress Lochia: appropriate Uterine Fundus: firm Incision: n/a DVT Evaluation: No evidence of DVT seen on physical exam.  Discharge Diagnoses: Term Pregnancy-delivered  Discharge Information: Date: 02/21/2016 Activity: pelvic rest Diet: routine Medications: PNV and Ibuprofen Condition: stable Instructions: refer to practice specific booklet Discharge to: home Follow-up Information    [facility] In 6 weeks.   Specialty:  Obstetrics and Gynecology   Why:  call to schedule post partum appointment   Contact information:   [address] [place] [phone]      Newborn Data: Live born female  Birth Weight: 8 lb 8.2 oz (3860 g) APGAR: 8, 9  Home with mother.  Caron Ode Y 02/21/2016, 10:11 AM

## 2016-02-21 NOTE — Lactation Note (Signed)
This note was copied from a baby's chart. Lactation Consultation Note  Patient Name: Boy Caryn BeeBrittany Sauber ZOXWR'UToday's Date: 02/21/2016 Reason for consult: Follow-up assessment;Other (Comment) (5% weight loss, 41 hours - Bili check = 4.5 )  Baby is 50 hours old and has been consistent at the breast, voids and stools Qs for age. Latch scores - 9-10 's  Mom has a hx PCOS - per mom had multiply breast changes and with other 2 children breast changes occurred And milk came in with problems. Some engorgement challenges with other babies.  Mom denies sore nipples and breast are filling fuller and warmer. Sore nipple and engorgement prevention and tx reviewed.  Per mom has a pump at home. LC discussed being that it is her 3rd baby the milk volume may be greater and if the breast are really  Full to express off 10 - 20 ml  1st , latch and have the baby feed long enough to soften breast well and offer 2nd breast.  If the baby only feeds 1st breast, don't allow the 2nd breast to get over full . Prevention if the key and protecting establishing milk supply.  Mother informed of post-discharge support and given phone number to the lactation department, including services for phone call assistance;  out-patient appointments; and breastfeeding support group. List of other breastfeeding resources in the community given in the handout. Encouraged  mother to call for problems or concerns related to breastfeeding.   Maternal Data Has patient been taught Hand Expression?:  (per mom comfortable with technique )  Feeding Feeding Type:  (last fed at 0800 ) Length of feed: 30 min (per mom )  LATCH Score/Interventions                      Lactation Tools Discussed/Used Pump Review: Milk Storage Initiated by:: MAI  Date initiated:: 02/21/16   Consult Status Consult Status: Complete Date: 02/21/16    Kathrin Greathouseorio, Praneel Haisley Ann 02/21/2016, 9:26 AM

## 2017-06-10 ENCOUNTER — Telehealth: Payer: Self-pay | Admitting: Advanced Practice Midwife

## 2017-06-10 NOTE — Telephone Encounter (Signed)
[facility], [facility] because US does not show IUP but pt is not having pain or bleeding. [facility] to set up labs tomorrow but concerned that pt needs to arrive as early as possible. Called pt to [facility] at 8[facility]00 am on 06/10/17 for hcg.    Pt returned call to get directions on where to go, discuss reasons for hcg follow up.  Ectopic precautions given, reasons to come to MAU reviewed.  Otherwise, pt to have hcg on Friday 10/18 and repeat in Monday 10/22 (done stat with immediate follow up and plan) in the office.

## 2017-06-29 ENCOUNTER — Encounter: Payer: 59 | Admitting: Obstetrics and Gynecology

## 2017-06-29 DIAGNOSIS — Z01419 Encounter for gynecological examination (general) (routine) without abnormal findings: Secondary | ICD-10-CM

## 2017-09-25 ENCOUNTER — Encounter (HOSPITAL_COMMUNITY): Payer: Self-pay | Admitting: *Deleted

## 2017-09-25 ENCOUNTER — Other Ambulatory Visit: Payer: Self-pay

## 2017-09-25 ENCOUNTER — Inpatient Hospital Stay (HOSPITAL_COMMUNITY)
Admission: AD | Admit: 2017-09-25 | Discharge: 2017-09-25 | Disposition: A | Discharge status: Home / Self Care | Payer: Medicaid Other | Source: Ambulatory Visit | Attending: Obstetrics and Gynecology | Admitting: Obstetrics and Gynecology

## 2017-09-25 DIAGNOSIS — Z3A20 20 weeks gestation of pregnancy: Secondary | ICD-10-CM | POA: Insufficient documentation

## 2017-09-25 DIAGNOSIS — O210 Mild hyperemesis gravidarum: Secondary | ICD-10-CM | POA: Diagnosis present

## 2017-09-25 DIAGNOSIS — O219 Vomiting of pregnancy, unspecified: Secondary | ICD-10-CM

## 2017-09-25 LAB — URINALYSIS, ROUTINE W REFLEX MICROSCOPIC
Bilirubin Urine: NEGATIVE
Glucose, UA: NEGATIVE mg/dL
Hgb urine dipstick: NEGATIVE
Ketones, ur: 20 mg/dL — AB
Leukocytes, UA: NEGATIVE
Nitrite: NEGATIVE
PROTEIN: 30 mg/dL — AB
Specific Gravity, Urine: 1.028 (ref 1.005–1.030)
pH: 5 (ref 5.0–8.0)

## 2017-09-25 LAB — COMPREHENSIVE METABOLIC PANEL
ALT: 12 U/L — ABNORMAL LOW (ref 14–54)
AST: 20 U/L (ref 15–41)
Albumin: 3.3 g/dL — ABNORMAL LOW (ref 3.5–5.0)
Alkaline Phosphatase: 55 U/L (ref 38–126)
Anion gap: 9 (ref 5–15)
BUN: 6 mg/dL (ref 6–20)
CO2: 18 mmol/L — ABNORMAL LOW (ref 22–32)
Calcium: 8.3 mg/dL — ABNORMAL LOW (ref 8.9–10.3)
Chloride: 106 mmol/L (ref 101–111)
Creatinine, Ser: 0.49 mg/dL (ref 0.44–1.00)
GFR calc Af Amer: >60 mL/min (ref >60)
GFR calc non Af Amer: >60 mL/min (ref >60)
Glucose, Bld: 97 mg/dL (ref 65–99)
Potassium: 3.9 mmol/L (ref 3.5–5.1)
Sodium: 133 mmol/L — ABNORMAL LOW (ref 135–145)
Total Bilirubin: 0.8 mg/dL (ref 0.3–1.2)
Total Protein: 6.1 g/dL — ABNORMAL LOW (ref 6.5–8.1)

## 2017-09-25 LAB — CBC
HCT: 33.4 % — ABNORMAL LOW (ref 36.0–46.0)
Hemoglobin: 11.5 g/dL — ABNORMAL LOW (ref 12.0–15.0)
MCH: 31.4 pg (ref 26.0–34.0)
MCHC: 34.4 g/dL (ref 30.0–36.0)
MCV: 91.3 fL (ref 78.0–100.0)
Platelets: 193 10*3/uL (ref 150–400)
RBC: 3.66 MIL/uL — ABNORMAL LOW (ref 3.87–5.11)
RDW: 13.7 % (ref 11.5–15.5)
WBC: 8.4 10*3/uL (ref 4.0–10.5)

## 2017-09-25 MED ORDER — DEXTROSE-NACL 5-0.45 % IV SOLN: Freq: Once | INTRAVENOUS | Status: DC

## 2017-09-25 MED ORDER — ONDANSETRON HCL 4 MG PO TABS: 4.0000 mg | ORAL_TABLET | Freq: Three times a day (TID) | ORAL | 1 refills | Status: AC | PRN

## 2017-09-25 MED ORDER — PROMETHAZINE HCL 25 MG PO TABS: 25.0000 mg | ORAL_TABLET | Freq: Four times a day (QID) | ORAL | 1 refills | Status: AC | PRN

## 2017-09-25 MED ORDER — DEXTROSE-NACL 5-0.45 % IV SOLN
Freq: Once | INTRAVENOUS | Status: AC
  Administered 2017-09-25: 08:00:00 via INTRAVENOUS

## 2017-09-25 MED ORDER — PROMETHAZINE HCL 25 MG/ML IJ SOLN
25.0000 mg | Freq: Once | INTRAMUSCULAR | Status: AC
  Administered 2017-09-25: 25 mg via INTRAVENOUS
  Filled 2017-09-25: qty 1

## 2017-09-25 MED ORDER — ONDANSETRON HCL 4 MG/2ML IJ SOLN
4.0000 mg | Freq: Once | INTRAMUSCULAR | Status: AC
  Administered 2017-09-25: 4 mg via INTRAVENOUS
  Filled 2017-09-25: qty 2

## 2017-09-25 NOTE — MAU Provider Note (Signed)
History     CSN: [id number]  Arrival date & time 09/25/17  [id number]   None     Chief Complaint  Patient presents with  . Emesis During Pregnancy    RN note Unable to keep down anything for 7 hrs. No one at home sick. No pain. One loose stool past 7hrs but not watery. Denies vag d/c or bleeding.     Past Medical History:  Diagnosis Date  . Miscarriage   . No pertinent past medical history   . PCOS (polycystic ovarian syndrome)     Past Surgical History:  Procedure Laterality Date  . DILATION AND EVACUATION  06/18/2011   Procedure: DILATATION AND EVACUATION (D&E);  Surgeon: Hal MoralesVanessa P Haygood, MD;  Location: [facility];  Service: Gynecology;  Laterality: N/A;    Family History  Problem Relation Age of Onset  . Arthritis Mother   . Diabetes Maternal Aunt   . Diabetes Maternal Grandmother   . Cancer Maternal Grandmother        BREAST; POST MENOPAUSAL  . Hyperlipidemia Maternal Grandmother   . Hypertension Maternal Grandmother   . Sickle cell trait Father   . Sickle cell trait Sister   . Diabetes Maternal Uncle   . Kidney disease Maternal Uncle        DIALYSIS  . Alcohol abuse Maternal Uncle   . Drug abuse Maternal Uncle   . Anesthesia problems Neg Hx     Social History   Tobacco Use  . Smoking status: Never Smoker  . Smokeless tobacco: Never Used  Substance Use Topics  . Alcohol use: No  . Drug use: No    OB History    Gravida Para Term Preterm AB Living   5 3 3   1 3    SAB TAB Ectopic Multiple Live Births   1     0 3      Review of Systems  Gastrointestinal: Positive for diarrhea, nausea and vomiting.  All other systems reviewed and are negative.   Allergies  Patient has no known allergies.  Home Medications    BP 117/72 (BP Location: Right Arm)   Pulse (!) 104   Temp 98.5 F (36.9 C)   Resp 18   Ht 5' 4.5" (1.638 m)   Wt 184 lb (83.5 kg)   BMI 31.10 kg/m   Physical Exam  Constitutional: She is oriented to person, place, and time. She  appears well-developed and well-nourished.  HENT:  Head: Normocephalic and atraumatic.  Cardiovascular: Normal rate and regular rhythm.  Pulmonary/Chest: Effort normal and breath sounds normal. No respiratory distress.  Abdominal: Soft. There is no tenderness.  Musculoskeletal: Normal range of motion.  Neurological: She is alert and oriented to person, place, and time.  Skin: Skin is warm and dry.  Psychiatric: She has a normal mood and affect. Her behavior is normal. Thought content normal.  Nursing note and vitals reviewed.   MAU Course  Procedures (including critical care time)  Labs Reviewed  URINALYSIS, ROUTINE W REFLEX MICROSCOPIC - Abnormal; Notable for the following components:      Result Value   Color, Urine AMBER (*)    APPearance CLOUDY (*)    Ketones, ur 20 (*)    Protein, ur 30 (*)    Bacteria, UA RARE (*)    Squamous Epithelial / LPF 6-30 (*)    All other components within normal limits  CBC - Abnormal; Notable for the following components:   RBC 3.66 (*)  Hemoglobin 11.5 (*)    HCT 33.4 (*)    All other components within normal limits  COMPREHENSIVE METABOLIC PANEL - Abnormal; Notable for the following components:   Sodium 133 (*)    CO2 18 (*)    Calcium 8.3 (*)    Total Protein 6.1 (*)    Albumin 3.3 (*)    ALT 12 (*)    All other components within normal limits   No results found.   No diagnosis found.  N/V in Pregnancy  MDM  IVF and Antiemetics; Pt feeling better; Tolerated ginger ale and crackers. Desires Discharge. Pt will follow up as needed or next regular scheduled appointment    Illene Bolus CNM

## 2017-09-25 NOTE — MAU Note (Signed)
Unable to keep down anything for 7 hrs. No one at home sick. No pain. One loose stool past 7hrs but not watery. Denies vag d/c or bleeding.

## 2017-09-25 NOTE — Discharge Instructions (Signed)
Nausea and Vomiting, Adult  Nausea is the feeling that you have an upset stomach or have to vomit. As nausea gets worse, it can lead to vomiting. Vomiting occurs when stomach contents are thrown up and out of the mouth. Vomiting can make you feel weak and cause you to become dehydrated. Dehydration can make you tired and thirsty, cause you to have a dry mouth, and decrease how often you urinate. Older adults and people with other diseases or a weak immune system are at higher risk for dehydration. It is important to treat your nausea and vomiting as told by your health care provider.  Follow these instructions at home:  Follow instructions from your health care provider about how to care for yourself at home.  Eating and drinking  Follow these recommendations as told by your health care provider:   Take an oral rehydration solution (ORS). This is a drink that is sold at pharmacies and retail stores.   Drink clear fluids in small amounts as you are able. Clear fluids include water, ice chips, diluted fruit juice, and low-calorie sports drinks.   Eat bland, easy-to-digest foods in small amounts as you are able. These foods include bananas, applesauce, rice, lean meats, toast, and crackers.   Avoid fluids that contain a lot of sugar or caffeine, such as energy drinks, sports drinks, and soda.   Avoid alcohol.   Avoid spicy or fatty foods.    General instructions   Drink enough fluid to keep your urine clear or pale yellow.   Wash your hands often. If soap and water are not available, use hand sanitizer.   Make sure that all people in your household wash their hands well and often.   Take over-the-counter and prescription medicines only as told by your health care provider.   Rest at home while you recover.   Watch your condition for any changes.   Breathe slowly and deeply when you feel nauseated.   Keep all follow-up visits as told by your health care provider. This is important.  Contact a health care  provider if:   You have a fever.   You cannot keep fluids down.   Your symptoms get worse.   You have new symptoms.   Your nausea does not go away after two days.   You feel light-headed or dizzy.   You have a headache.   You have muscle cramps.  Get help right away if:   You have pain in your chest, neck, arm, or jaw.   You feel extremely weak or you faint.   You have persistent vomiting.   You see blood in your vomit.   Your vomit looks like black coffee grounds.   You have bloody or black stools or stools that look like tar.   You have a severe headache, a stiff neck, or both.   You have a rash.   You have severe pain, cramping, or bloating in your abdomen.   You have trouble breathing or you are breathing very quickly.   Your heart is beating very quickly.   Your skin feels cold and clammy.   You feel confused.   You have pain when you urinate.   You have signs of dehydration, such as:  ? Dark urine, very little urine, or no urine.  ? Cracked lips.  ? Dry mouth.  ? Sunken eyes.  ? Sleepiness.  ? Weakness.  These symptoms may represent a serious problem that is an emergency. Do not   wait to see if the symptoms will go away. Get medical help right away. Call your local emergency services (911 in the [place] Do not drive yourself to the hospital.  This information is not intended to replace advice given to you by your health care provider. Make sure you discuss any questions you have with your health care provider.  Document Released: 08/10/2005 Document Revised: 01/13/2016 Document Reviewed: 04/16/2015  Elsevier Interactive Patient Education  2018 [employer]

## 2017-09-25 NOTE — MAU Note (Signed)
Patient given saltine crackers and gingerale for po challenge.

## 2017-10-26 ENCOUNTER — Other Ambulatory Visit: Payer: Self-pay | Admitting: Obstetrics & Gynecology

## 2017-10-26 DIAGNOSIS — O2202 Varicose veins of lower extremity in pregnancy, second trimester: Secondary | ICD-10-CM

## 2017-10-27 ENCOUNTER — Other Ambulatory Visit: Payer: Medicaid Other

## 2017-11-01 ENCOUNTER — Other Ambulatory Visit: Payer: Self-pay | Admitting: Obstetrics & Gynecology

## 2017-11-01 DIAGNOSIS — M7989 Other specified soft tissue disorders: Secondary | ICD-10-CM

## 2017-11-02 ENCOUNTER — Encounter (HOSPITAL_COMMUNITY): Payer: Self-pay | Admitting: *Deleted

## 2017-11-02 ENCOUNTER — Inpatient Hospital Stay (HOSPITAL_COMMUNITY)
Admission: AD | Admit: 2017-11-02 | Discharge: 2017-11-02 | Disposition: A | Discharge status: Home / Self Care | Payer: Medicaid Other | Source: Ambulatory Visit | Attending: Obstetrics and Gynecology | Admitting: Obstetrics and Gynecology

## 2017-11-02 DIAGNOSIS — O368120 Decreased fetal movements, second trimester, not applicable or unspecified: Secondary | ICD-10-CM | POA: Diagnosis present

## 2017-11-02 DIAGNOSIS — O99282 Endocrine, nutritional and metabolic diseases complicating pregnancy, second trimester: Secondary | ICD-10-CM | POA: Diagnosis not present

## 2017-11-02 DIAGNOSIS — E282 Polycystic ovarian syndrome: Secondary | ICD-10-CM | POA: Insufficient documentation

## 2017-11-02 DIAGNOSIS — Z3A25 25 weeks gestation of pregnancy: Secondary | ICD-10-CM | POA: Diagnosis not present

## 2017-11-02 DIAGNOSIS — O368121 Decreased fetal movements, second trimester, fetus 1: Secondary | ICD-10-CM

## 2017-11-02 LAB — URINALYSIS, ROUTINE W REFLEX MICROSCOPIC
Bilirubin Urine: NEGATIVE
Glucose, UA: NEGATIVE mg/dL
Hgb urine dipstick: NEGATIVE
Ketones, ur: 20 mg/dL — AB
Leukocytes, UA: NEGATIVE
Nitrite: NEGATIVE
Protein, ur: NEGATIVE mg/dL
Specific Gravity, Urine: 1.017 (ref 1.005–1.030)
pH: 6 (ref 5.0–8.0)

## 2017-11-02 NOTE — MAU Provider Note (Signed)
Chief Complaint:  Decreased Fetal Movement   None    HPI: Robin [name] is a 27 y.o. [id number] at [redacted]w[redacted]d who presents to maternity admissions reporting decreased fetal movement.  Denies bleeding, contractions or vaginal discharge.  Prenatal hx of unremarkable.  Pregnancy Course:   Past Medical History:  Diagnosis Date  . Miscarriage   . No pertinent past medical history   . PCOS (polycystic ovarian syndrome)    OB History  Gravida Para Term Preterm AB Living  5 3 3   1 3   SAB TAB Ectopic Multiple Live Births  1     0 3    # Outcome Date GA Lbr Len/2nd Weight Sex Delivery Anes PTL Lv  5 Current           4 Term 02/19/16 [redacted]w[redacted]d 03:25 / 00:19 3.86 kg (8 lb 8.2 oz) M Vag-Spont None  LIV  3 Term 03/14/14 [redacted]w[redacted]d 16:43 / 00:16 3.274 kg (7 lb 3.5 oz) F Vag-Spont EPI  LIV  2 Term 09/14/12 [redacted]w[redacted]d 09:50 / 01:35 3.405 kg (7 lb 8.1 oz) M Vag-Spont EPI  LIV     Birth Comments: none  1 SAB 06/18/11 [redacted]w[redacted]d            Birth Comments: D&E     Past Surgical History:  Procedure Laterality Date  . DILATION AND EVACUATION  06/18/2011   Procedure: DILATATION AND EVACUATION (D&E);  Surgeon: Hal Morales, MD;  Location: [facility];  Service: Gynecology;  Laterality: N/A;   Family History  Problem Relation Age of Onset  . Arthritis Mother   . Diabetes Maternal Aunt   . Diabetes Maternal Grandmother   . Cancer Maternal Grandmother        BREAST; POST MENOPAUSAL  . Hyperlipidemia Maternal Grandmother   . Hypertension Maternal Grandmother   . Sickle cell trait Father   . Sickle cell trait Sister   . Diabetes Maternal Uncle   . Kidney disease Maternal Uncle        DIALYSIS  . Alcohol abuse Maternal Uncle   . Drug abuse Maternal Uncle   . Anesthesia problems Neg Hx    Social History   Tobacco Use  . Smoking status: Never Smoker  . Smokeless tobacco: Never Used  Substance Use Topics  . Alcohol use: No  . Drug use: No   No Known Allergies Medications Prior to Admission  Medication Sig  Dispense Refill Last Dose  . Prenatal Vit-Fe Fumarate-FA (PRENATAL MULTIVITAMIN) TABS tablet Take 1 tablet by mouth daily.   11/01/2017 at Unknown time  . ondansetron (ZOFRAN) 4 MG tablet Take 1 tablet (4 mg total) by mouth every 8 (eight) hours as needed for nausea or vomiting. (Patient not taking: Reported on 11/02/2017) 30 tablet 1 Not Taking at Unknown time  . promethazine (PHENERGAN) 25 MG tablet Take 1 tablet (25 mg total) by mouth every 6 (six) hours as needed for nausea or vomiting. (Patient not taking: Reported on 11/02/2017) 30 tablet 1 Not Taking at Unknown time    I have reviewed patient's Past Medical Hx, Surgical Hx, Family Hx, Social Hx, medications and allergies.   ROS:  Review of Systems  Constitutional: Negative.   HENT: Negative.   Eyes: Negative.   Respiratory: Negative.   Cardiovascular: Negative.   Gastrointestinal: Negative.   Endocrine: Negative.   Genitourinary: Negative.   Musculoskeletal: Negative.   Skin: Negative.   Allergic/Immunologic: Negative.   Neurological: Negative.   Hematological: Negative.   Psychiatric/Behavioral: Negative.  Physical Exam   Patient Vitals for the past 24 hrs:  BP Temp Temp src Pulse Resp Height Weight  11/02/17 2048 107/60 97.8 F (36.6 C) Oral 94 20 5\' 5"  (1.651 m) 86.4 kg (190 lb 8 oz)   Constitutional: Well-developed, well-nourished female in no acute distress.  Cardiovascular: normal rate Respiratory: normal effort GI: Abd soft, non-tender, gravid appropriate for gestational age.  MS: Extremities nontender, no edema, normal ROM Neurologic: Alert and oriented x 4.  GU: Deferred    FHT:  Baseline 150, moderate variability, accelerations present, no decelerations Contractions: None   Labs: Results for orders placed or performed during the hospital encounter of 11/02/17 (from the past 24 hour(s))  Urinalysis, Routine w reflex microscopic     Status: Abnormal   Collection Time: 11/02/17  8:52 PM  Result Value Ref  Range   Color, Urine YELLOW YELLOW   APPearance HAZY (A) CLEAR   Specific Gravity, Urine 1.017 1.005 - 1.030   pH 6.0 5.0 - 8.0   Glucose, UA NEGATIVE NEGATIVE mg/dL   Hgb urine dipstick NEGATIVE NEGATIVE   Bilirubin Urine NEGATIVE NEGATIVE   Ketones, ur 20 (A) NEGATIVE mg/dL   Protein, ur NEGATIVE NEGATIVE mg/dL   Nitrite NEGATIVE NEGATIVE   Leukocytes, UA NEGATIVE NEGATIVE    Imaging:  No results found.  MAU Course: Orders Placed This Encounter  Procedures  . Urinalysis, Routine w reflex microscopic   No orders of the defined types were placed in this encounter.   MDM: PE, urine and EFM.   Assessment: Decrease fetal movement  Plan: Discharge home in stable condition. Discussed movement at 25 weeks. Follow up in regular visit.      Kenney Housemanrothero, Nancy Jean, CNM 11/02/2017 9:44 PM

## 2017-11-02 NOTE — MAU Note (Signed)
PT SAYS SHE WANTED TO GO TO OFFICE- BUT  SINCE 3-7- SHE HAS FELT LESS MOVEMENT .   TODAY ONLY MOVED IF SHE RUBBED HER ABD  OR ATE.     SHE CALLED OFFICE.   HAS AN APPOINTMENT  ON 3-25.    LAST SEX-  SAT.  FEELS  SOME CRAMPING .    GOES TO [facility] WEEKLY

## 2017-11-03 ENCOUNTER — Ambulatory Visit
Admission: RE | Admit: 2017-11-03 | Discharge: 2017-11-03 | Disposition: A | Discharge status: Home / Self Care | Payer: Medicaid Other | Source: Ambulatory Visit | Attending: Obstetrics & Gynecology | Admitting: Obstetrics & Gynecology

## 2017-11-03 DIAGNOSIS — M7989 Other specified soft tissue disorders: Secondary | ICD-10-CM

## 2018-02-17 ENCOUNTER — Inpatient Hospital Stay (HOSPITAL_COMMUNITY)
Admission: AD | Admit: 2018-02-17 | Discharge: 2018-02-17 | Disposition: A | Discharge status: Home / Self Care | Payer: Medicaid Other | Source: Ambulatory Visit | Attending: Obstetrics and Gynecology | Admitting: Obstetrics and Gynecology

## 2018-02-17 ENCOUNTER — Encounter (HOSPITAL_COMMUNITY): Payer: Self-pay | Admitting: *Deleted

## 2018-02-17 ENCOUNTER — Other Ambulatory Visit: Payer: Self-pay

## 2018-02-17 ENCOUNTER — Inpatient Hospital Stay (HOSPITAL_COMMUNITY): Payer: Medicaid Other

## 2018-02-17 DIAGNOSIS — O368930 Maternal care for other specified fetal problems, third trimester, not applicable or unspecified: Secondary | ICD-10-CM | POA: Insufficient documentation

## 2018-02-17 DIAGNOSIS — O99283 Endocrine, nutritional and metabolic diseases complicating pregnancy, third trimester: Secondary | ICD-10-CM | POA: Diagnosis not present

## 2018-02-17 DIAGNOSIS — O368390 Maternal care for abnormalities of the fetal heart rate or rhythm, unspecified trimester, not applicable or unspecified: Secondary | ICD-10-CM | POA: Diagnosis present

## 2018-02-17 DIAGNOSIS — O480 Post-term pregnancy: Secondary | ICD-10-CM

## 2018-02-17 DIAGNOSIS — E282 Polycystic ovarian syndrome: Secondary | ICD-10-CM | POA: Diagnosis not present

## 2018-02-17 DIAGNOSIS — Z3A40 40 weeks gestation of pregnancy: Secondary | ICD-10-CM | POA: Insufficient documentation

## 2018-02-17 NOTE — Discharge Instructions (Signed)

## 2018-02-17 NOTE — MAU Provider Note (Signed)
History     Chief Complaint  Patient presents with  . NST & BPP   HPI  Robin [name] is a 27 y.o. [id number] at 6934w5d sent from office today for NST/BPP for audible fetal arrhythmia on EFM. Denies LOF/VB/ctx. Normal FM.  This was first incidence of fetal arrhythmia audible in clinic. Patient reports similar arrhythmia with her second son, diagnosed benign and no further sequelae noted after birth.    OB History    Gravida  5   Para  3   Term  3   Preterm      AB  1   Living  3     SAB  1   TAB      Ectopic      Multiple  0   Live Births  3           Past Medical History:  Diagnosis Date  . Miscarriage   . No pertinent past medical history   . PCOS (polycystic ovarian syndrome)     Past Surgical History:  Procedure Laterality Date  . DILATION AND EVACUATION  06/18/2011   Procedure: DILATATION AND EVACUATION (D&E);  Surgeon: Hal MoralesVanessa P Haygood, MD;  Location: [facility];  Service: Gynecology;  Laterality: N/A;    Family History  Problem Relation Age of Onset  . Arthritis Mother   . Diabetes Maternal Aunt   . Diabetes Maternal Grandmother   . Cancer Maternal Grandmother        BREAST; POST MENOPAUSAL  . Hyperlipidemia Maternal Grandmother   . Hypertension Maternal Grandmother   . Sickle cell trait Father   . Sickle cell trait Sister   . Diabetes Maternal Uncle   . Kidney disease Maternal Uncle        DIALYSIS  . Alcohol abuse Maternal Uncle   . Drug abuse Maternal Uncle   . Anesthesia problems Neg Hx     Social History   Tobacco Use  . Smoking status: Never Smoker  . Smokeless tobacco: Never Used  Substance Use Topics  . Alcohol use: No  . Drug use: No    Allergies: No Known Allergies  Medications Prior to Admission  Medication Sig Dispense Refill Last Dose  . ondansetron (ZOFRAN) 4 MG tablet Take 1 tablet (4 mg total) by mouth every 8 (eight) hours as needed for nausea or vomiting. (Patient not taking: Reported on 11/02/2017) 30 tablet 1  Not Taking at Unknown time  . Prenatal Vit-Fe Fumarate-FA (PRENATAL MULTIVITAMIN) TABS tablet Take 1 tablet by mouth daily.   11/01/2017 at Unknown time  . promethazine (PHENERGAN) 25 MG tablet Take 1 tablet (25 mg total) by mouth every 6 (six) hours as needed for nausea or vomiting. (Patient not taking: Reported on 11/02/2017) 30 tablet 1 Not Taking at Unknown time    ROS Physical Exam     Physical Exam: Blood pressure 109/72, pulse 94, temperature 98.3 F (36.8 C), temperature source Oral, resp. rate 20, height 5\' 5"  (1.651 m), weight 88.5 kg (195 lb), SpO2 100 %, unknown if currently breastfeeding. General: NAD Ext: no edema Neuro: DTRs normal Other: GU deferred  FHR 140, mod var, + 15x15 accels, no decels Ctx - occasional, mild    ED Course  Procedures  NST reactive per above. Sono prelim report likely PAC's, AFI wnl, good fetal breathing movements and tone. Vertex, ROT.  A/P: [id number] at 6734w5d  Fetal arrhythmia, suspect PAC Previous hx of pregnancy complicated by PAC's  Findings reviewed with patient and her  spouse. Patient is planning OOH birth at [facility]. Will review findings with Dr. Billy Coast and make final recommendation for birth site.  MFM recommendation also pending for F/U imaging.    D/C home now and F/U in office as previously scheduled.    Neta Mends, CNM, MSN 02/17/2018, 7:09 PM

## 2018-02-17 NOTE — MAU Note (Signed)
Pt sent from office for NST & BPP secondary fetal arrythmia.  Denies LOF or VB.  Reports +FM.

## 2018-05-07 ENCOUNTER — Encounter (HOSPITAL_COMMUNITY): Payer: Self-pay
